# Patient Record
Sex: Female | Born: 1950 | Race: White | Hispanic: No | Marital: Married | Smoking: Former smoker
Health system: Southern US, Community
[De-identification: ages and names within clinical notes are randomized; demographics above are authoritative.]

## PROBLEM LIST (undated history)

## (undated) DIAGNOSIS — K635 Polyp of colon: Secondary | ICD-10-CM

## (undated) DIAGNOSIS — E041 Nontoxic single thyroid nodule: Secondary | ICD-10-CM

## (undated) DIAGNOSIS — R519 Headache, unspecified: Secondary | ICD-10-CM

## (undated) DIAGNOSIS — R32 Unspecified urinary incontinence: Secondary | ICD-10-CM

## (undated) HISTORY — PX: COLONOSCOPY: SHX174

## (undated) HISTORY — DX: Polyp of colon: K63.5

## (undated) HISTORY — DX: Nontoxic single thyroid nodule: E04.1

## (undated) HISTORY — DX: Unspecified urinary incontinence: R32

## (undated) HISTORY — PX: TUBAL LIGATION: SHX77

## (undated) HISTORY — PX: TONSILLECTOMY: SUR1361

---

## 2007-11-16 DIAGNOSIS — E042 Nontoxic multinodular goiter: Secondary | ICD-10-CM | POA: Insufficient documentation

## 2010-02-18 ENCOUNTER — Emergency Department: Payer: Self-pay | Admitting: Emergency Medicine

## 2010-03-03 ENCOUNTER — Ambulatory Visit: Payer: Self-pay | Admitting: Gastroenterology

## 2010-03-10 ENCOUNTER — Ambulatory Visit: Payer: Self-pay | Admitting: Gastroenterology

## 2010-03-23 ENCOUNTER — Ambulatory Visit: Payer: Self-pay | Admitting: Gastroenterology

## 2012-12-20 LAB — HM PAP SMEAR: HM PAP: NORMAL

## 2016-08-26 ENCOUNTER — Emergency Department
Admission: EM | Admit: 2016-08-26 | Discharge: 2016-08-26 | Disposition: A | Discharge status: Home / Self Care | Payer: Medicare Other | Attending: Emergency Medicine | Admitting: Emergency Medicine

## 2016-08-26 ENCOUNTER — Encounter: Payer: Self-pay | Admitting: Emergency Medicine

## 2016-08-26 DIAGNOSIS — Y939 Activity, unspecified: Secondary | ICD-10-CM | POA: Insufficient documentation

## 2016-08-26 DIAGNOSIS — S0990XA Unspecified injury of head, initial encounter: Secondary | ICD-10-CM | POA: Diagnosis not present

## 2016-08-26 DIAGNOSIS — W01198A Fall on same level from slipping, tripping and stumbling with subsequent striking against other object, initial encounter: Secondary | ICD-10-CM | POA: Insufficient documentation

## 2016-08-26 DIAGNOSIS — Y929 Unspecified place or not applicable: Secondary | ICD-10-CM | POA: Insufficient documentation

## 2016-08-26 DIAGNOSIS — S0101XA Laceration without foreign body of scalp, initial encounter: Secondary | ICD-10-CM | POA: Insufficient documentation

## 2016-08-26 DIAGNOSIS — Z87891 Personal history of nicotine dependence: Secondary | ICD-10-CM | POA: Diagnosis not present

## 2016-08-26 DIAGNOSIS — Y999 Unspecified external cause status: Secondary | ICD-10-CM | POA: Insufficient documentation

## 2016-08-26 DIAGNOSIS — W19XXXA Unspecified fall, initial encounter: Secondary | ICD-10-CM

## 2016-08-26 NOTE — ED Triage Notes (Signed)
Fell and hit head on stove. No LOC.  Abrasion to left forehead.  AAOx3.  Skin warm and dry.  MAE equally and strong.  Gait steady.

## 2016-08-26 NOTE — ED Provider Notes (Signed)
[facility] Emergency Department Provider Note  ____________________________________________  Time seen: Approximately 10:27 PM  I have reviewed the triage vital signs and the nursing notes.   HISTORY  Chief Complaint Head Injury    HPI Earlyn Zbikowski is a 66 y.o. female who presents emergency department complaining of fall, striking her head, scalp laceration. Patient reports that she suffered a mechanical fall after her cat tripped her. She fell striking her head against the stove. Patient denies any loss of consciousness. She denies any headache, visual changes, neck pain, chest pain, shortness of breath, abdominal pain, nausea or vomiting. Patient reports that she did suffer a minor laceration to the left side of the scalp but is unable to visualize on her own. Patient is up-to-date on all immunizations.Patient is not taking any blood thinners. She has not tried any medications prior to arrival. No other complaints at this time.   History reviewed. No pertinent past medical history.  There are no active problems to display for this patient.   History reviewed. No pertinent surgical history.  Prior to Admission medications   Not on File    Allergies Patient has no known allergies.  No family history on file.  Social History Social History  Substance Use Topics  . Smoking status: Former Games developer  . Smokeless tobacco: Never Used  . Alcohol use Not on file     Review of Systems  Constitutional: No fever/chills Eyes: No visual changes. No discharge ENT: No upper respiratory complaints. Cardiovascular: no chest pain. Respiratory: no cough. No SOB. Gastrointestinal: No abdominal pain.  No nausea, no vomiting.  Musculoskeletal: Negative for musculoskeletal pain. Skin: Positive for scalp laceration to the left side of the head. Neurological: Negative for headaches, focal weakness or numbness. 10-point ROS otherwise  negative.  ____________________________________________   PHYSICAL EXAM:  VITAL SIGNS: ED Triage Vitals  Enc Vitals Group     BP 08/26/16 2106 134/90     Pulse Rate 08/26/16 2106 85     Resp 08/26/16 2106 16     Temp 08/26/16 2106 98 F (36.7 C)     Temp Source 08/26/16 2106 Oral     SpO2 08/26/16 2106 100 %     Weight 08/26/16 2106 150 lb (68 kg)     Height 08/26/16 2106 5\' 3"  (1.6 m)     Head Circumference --      Peak Flow --      Pain Score 08/26/16 2103 4     Pain Loc --      Pain Edu? --      Excl. in GC? --      Constitutional: Alert and oriented. Well appearing and in no acute distress. Eyes: Conjunctivae are normal. PERRL. EOMI. Head: There 0.5 cm laceration noted to the left side of the scalp. Superficial in nature. No bleeding. No foreign body. Edges are well approximated. No underlying osseous tenderness to palpation. No palpable abnormality. No battle signs, raccoon eyes, serosanguineous fluid drainage from the ears or nares. ENT:      Ears:       Nose: No congestion/rhinnorhea.      Mouth/Throat: Mucous membranes are moist.  Neck: No stridor.  No cervical spine tenderness to palpation.  Cardiovascular: Normal rate, regular rhythm. Normal S1 and S2.  Good peripheral circulation. Respiratory: Normal respiratory effort without tachypnea or retractions. Lungs CTAB. Good air entry to the bases with no decreased or absent breath sounds. Musculoskeletal: Full range of motion to all extremities. No  gross deformities appreciated. Neurologic:  Normal speech and language. No gross focal neurologic deficits are appreciated. Cranial nerves II through XII grossly intact. Negative pronator drift or Romberg's. Equal grip strength. Sensation intact in all extremity dermatomes. Skin:  Skin is warm, dry and intact. No rash noted. Psychiatric: Mood and affect are normal. Speech and behavior are normal. Patient exhibits appropriate insight and  judgement.   ____________________________________________   LABS (all labs ordered are listed, but only abnormal results are displayed)  Labs Reviewed - No data to display ____________________________________________  EKG   ____________________________________________  RADIOLOGY   No results found.  ____________________________________________    PROCEDURES  Procedure(s) performed:    Procedures    Canadian CT Head Rule   CT head is recommended if yes to ANY of the following:   Major Criteria ("high risk" for an injury requiring neurosurgical intervention, sensitivity 100%):   No.   GCS < 15 at 2 hours post-injury No.   Suspected open or depressed skull fracture No.   Any sign of basilar skull fracture? (Hemotympanum, racoon eyes, battle's sign, CSF oto/rhinorrhea) No.   ? 2 episodes of vomiting No.   Age ? 65   Minor Criteria ("medium" risk for an intracranial traumatic finding, sensitivity 83-100%):   No.   Retrograde Amnesia to the Event ? 30 minutes No.   "Dangerous" Mechanism? (Pedestrian struck by motor vehicle, occupant ejected from motor vehicle, fall from >3 ft or >5 stairs.)   Based on my evaluation of the patient, including application of this decision instrument, CT head to evaluate for traumatic intracranial injury is not indicated at this time. I have discussed this recommendation with the patient who states understanding and agreement with this plan.  NEXUS C-spine Criteria   C-spine imaging is recommended if yes to ANY of the following (Mneumonic is "NSAID"):   No.  N - neurologic (focal) deficit present No.   S - spinal midline tenderness present No.  A - altered level of consciousness present No.    I  - intoxication present No.   D - distracting injury present   Based on my evaluation of the patient, including application of this decision instrument, cervical spine imaging to evaluate for injury is not indicated at this time. I have  discussed this recommendation with the patient who states understanding and agreement with this plan.    Medications - No data to display   ____________________________________________   INITIAL IMPRESSION / ASSESSMENT AND PLAN / ED COURSE  Pertinent labs & imaging results that were available during my care of the patient were reviewed by me and considered in my medical decision making (see chart for details).  Review of the [place] CSRS was performed in accordance of the [facility] prior to dispensing any controlled drugs.     Patient's diagnosis is consistent with fall resulting in minor head injury and minor laceration of the scalp. This time, patient is neurologically intact. Discussed at length imaging versus no imaging. Patient does not have any concerning factors. Exam is reassuring. At this time, patient opts for no imaging. I agree with this decision as patient is negative Canadian head CT and Nexus criteria. Laceration is superficial in nature and does not require closure. Tylenol and Motrin at home as needed. She will follow primary care as needed.  Patient is given ED precautions to return to the ED for any worsening or new symptoms.     ____________________________________________  FINAL CLINICAL IMPRESSION(S) / ED DIAGNOSES  Final diagnoses:  Fall, initial encounter  Minor head injury, initial encounter  Laceration of scalp, initial encounter      NEW MEDICATIONS STARTED DURING THIS VISIT:  New Prescriptions   No medications on file        This chart was dictated using voice recognition software/Dragon. Despite best efforts to proofread, errors can occur which can change the meaning. Any change was purely unintentional.    Racheal PatchesCuthriell, Jonathan D, PA-C 08/26/16 2232    Myrna BlazerSchaevitz, David Matthew, MD 08/26/16 2350

## 2017-02-23 ENCOUNTER — Encounter: Payer: Self-pay | Admitting: Obstetrics and Gynecology

## 2017-02-23 ENCOUNTER — Ambulatory Visit (INDEPENDENT_AMBULATORY_CARE_PROVIDER_SITE_OTHER): Payer: Medicare Other | Admitting: Obstetrics and Gynecology

## 2017-02-23 VITALS — BP 124/78 | Ht 63.0 in | Wt 151.0 lb

## 2017-02-23 DIAGNOSIS — Z131 Encounter for screening for diabetes mellitus: Secondary | ICD-10-CM

## 2017-02-23 DIAGNOSIS — Z13220 Encounter for screening for lipoid disorders: Secondary | ICD-10-CM

## 2017-02-23 DIAGNOSIS — Z0000 Encounter for general adult medical examination without abnormal findings: Secondary | ICD-10-CM | POA: Diagnosis not present

## 2017-02-23 DIAGNOSIS — Z1231 Encounter for screening mammogram for malignant neoplasm of breast: Secondary | ICD-10-CM | POA: Diagnosis not present

## 2017-02-23 DIAGNOSIS — Z1211 Encounter for screening for malignant neoplasm of colon: Secondary | ICD-10-CM

## 2017-02-23 DIAGNOSIS — Z1331 Encounter for screening for depression: Secondary | ICD-10-CM

## 2017-02-23 DIAGNOSIS — E2839 Other primary ovarian failure: Secondary | ICD-10-CM

## 2017-02-23 DIAGNOSIS — R87612 Low grade squamous intraepithelial lesion on cytologic smear of cervix (LGSIL): Secondary | ICD-10-CM | POA: Diagnosis not present

## 2017-02-23 DIAGNOSIS — Z1321 Encounter for screening for nutritional disorder: Secondary | ICD-10-CM | POA: Diagnosis not present

## 2017-02-23 DIAGNOSIS — Z1239 Encounter for other screening for malignant neoplasm of breast: Secondary | ICD-10-CM

## 2017-02-23 DIAGNOSIS — Z1329 Encounter for screening for other suspected endocrine disorder: Secondary | ICD-10-CM

## 2017-02-23 DIAGNOSIS — Z124 Encounter for screening for malignant neoplasm of cervix: Secondary | ICD-10-CM | POA: Diagnosis not present

## 2017-02-23 DIAGNOSIS — Z1339 Encounter for screening examination for other mental health and behavioral disorders: Secondary | ICD-10-CM | POA: Diagnosis not present

## 2017-02-23 DIAGNOSIS — Z01419 Encounter for gynecological examination (general) (routine) without abnormal findings: Secondary | ICD-10-CM

## 2017-02-23 NOTE — Progress Notes (Addendum)
Routine Annual Gynecology Examination   PCP: Conard Novak, MD  Chief Complaint  Patient presents with  . Annual Exam    History of Present Illness: Patient is a 67 y.o. [id number] presents for annual exam. The patient has no complaints today.   Menopausal bleeding: denies  Menopausal symptoms: denies  Breast symptoms: denies  Last pap smear: 4 years ago.  Result Normal  Last mammogram: multiplle years ago.  Result Normal  Past Medical History:  Diagnosis Date  . Colon polyp   . Thyroid nodule   . Urinary incontinence     Past Surgical History:  Procedure Laterality Date  . COLONOSCOPY    . TONSILLECTOMY    . TUBAL LIGATION      Prior to Admission medications   Medication Sig Start Date End Date Taking? Authorizing Provider  cholecalciferol (VITAMIN D) 400 units TABS tablet Take 400 Units by mouth.   Yes [provider]  Melatonin 1 MG TABS Take by mouth.   Yes [provider]  Pyridoxine HCl (VITAMIN B-6) 500 MG tablet Take 500 mg by mouth daily.   Yes [provider]   Allergies: No Known Allergies  Obstetric History: [id number]  Social History   Socioeconomic History  . Marital status: Married    Spouse name: Not on file  . Number of children: Not on file  . Years of education: Not on file  . Highest education level: Not on file  Social Needs  . Financial resource strain: Not on file  . Food insecurity - worry: Not on file  . Food insecurity - inability: Not on file  . Transportation needs - medical: Not on file  . Transportation needs - non-medical: Not on file  Occupational History  . Not on file  Tobacco Use  . Smoking status: Former Games developer  . Smokeless tobacco: Never Used  Substance and Sexual Activity  . Alcohol use: No    Frequency: Never  . Drug use: No  . Sexual activity: Not Currently    Birth control/protection: Post-menopausal  Other Topics Concern  . Not on file  Social History Narrative  . Not on  file    Family History  Problem Relation Age of Onset  . Diabetes Mellitus II Mother   . Heart attack Father   . Stroke Father   . Diabetes Mellitus II Sister     Review of Systems  Constitutional: Negative.   HENT: Negative.   Eyes: Negative.   Respiratory: Negative.   Cardiovascular: Negative.   Gastrointestinal: Negative.   Genitourinary: Negative.   Musculoskeletal: Negative.   Skin: Negative.   Neurological: Negative.   Psychiatric/Behavioral: Negative.      Physical Exam Vitals: BP 124/78   Ht 5\' 3"  (1.6 m)   Wt 151 lb (68.5 kg)   BMI 26.75 kg/m   Physical Exam  Constitutional: She is oriented to person, place, and time. She appears well-developed and well-nourished. No distress.  Genitourinary: Uterus normal. Pelvic exam was performed with patient supine. There is no rash, tenderness, lesion or injury on the right labia. There is no rash, tenderness, lesion or injury on the left labia. No erythema, tenderness or bleeding in the vagina. No signs of injury around the vagina. No vaginal discharge found. Right adnexum does not display mass, does not display tenderness and does not display fullness. Left adnexum does not display mass, does not display tenderness and does not display fullness. Cervix does not exhibit motion tenderness, lesion, discharge or  polyp.   Uterus is mobile and anteverted. Uterus is not enlarged, tender or exhibiting a mass.  HENT:  Head: Normocephalic and atraumatic.  Eyes: EOM are normal. No scleral icterus.  Neck: Normal range of motion. Neck supple. No thyromegaly present.  Cardiovascular: Normal rate and regular rhythm. Exam reveals no gallop and no friction rub.  No murmur heard. Pulmonary/Chest: Effort normal and breath sounds normal. No respiratory distress. She has no wheezes. She has no rales. Right breast exhibits no inverted nipple, no mass, no nipple discharge, no skin change and no tenderness. Left breast exhibits no inverted nipple,  no mass, no nipple discharge, no skin change and no tenderness.  Abdominal: Soft. Bowel sounds are normal. She exhibits no distension and no mass. There is no tenderness. There is no rebound and no guarding.  Musculoskeletal: Normal range of motion. She exhibits no edema or tenderness.  Lymphadenopathy:    She has no cervical adenopathy.       Right: No inguinal adenopathy present.       Left: No inguinal adenopathy present.  Neurological: She is alert and oriented to person, place, and time. No cranial nerve deficit.  Skin: Skin is warm and dry. No rash noted. No erythema.  Psychiatric: She has a normal mood and affect. Her behavior is normal. Judgment normal.     Female chaperone present for pelvic and breast  portions of the physical exam  Results: AUDIT Questionnaire (screen for alcoholism): 1 PHQ-9: 2   Assessment and Plan:  67 y.o. [id number] female here for routine annual gynecologic examination  Plan: Problem List Items Addressed This Visit    None    Visit Diagnoses    Women's annual routine gynecological examination    -  Primary   Relevant Orders   Hgb A1c w/o eAG   CBC   Comprehensive metabolic panel   Lipid Panel With LDL/HDL Ratio   Pap IG (Image Guided) (Completed)   DG Bone Density   Screening for depression       Screening for alcoholism       Pap smear for cervical cancer screening       Relevant Orders   Pap IG (Image Guided) (Completed)   Screening for diabetes mellitus       Relevant Orders   Hgb A1c w/o eAG   Screening cholesterol level       Relevant Orders   Lipid Panel With LDL/HDL Ratio   Screening for thyroid disorder       Screening for breast cancer       Laboratory exam ordered as part of routine general medical examination       Relevant Orders   CBC   Comprehensive metabolic panel   Encounter for vitamin deficiency screening       Screen for colon cancer       Relevant Orders   Ambulatory referral to Gastroenterology   Estrogen  deficiency       Relevant Orders   DG Bone Density      Screening: -- Blood pressure screen normal -- Colonoscopy - due - will schedule -- Mammogram - due. Patient to call [facility] to arrange. She understands that it is her responsibility to arrange this. -- Weight screening: normal -- Depression screening negative (PHQ-9) -- Nutrition: normal -- cholesterol screening: will obtain -- osteoporosis screening: not due -- tobacco screening: not using -- alcohol screening: AUDIT questionnaire indicates low-risk usage. -- family history of breast cancer screening: done. not at high  risk. -- no evidence of domestic violence or intimate partner violence. -- STD screening: gonorrhea/chlamydia NAAT not collected per patient request. -- pap smear collected per ASCCP guidelines -- flu vaccine declined -- HPV vaccination series: not eligilbe    Thomasene MohairStephen Garmon Dehn, MD 03/06/2017 8:27 AM

## 2017-02-24 ENCOUNTER — Encounter: Payer: Self-pay | Admitting: Obstetrics and Gynecology

## 2017-02-28 ENCOUNTER — Other Ambulatory Visit: Payer: Self-pay

## 2017-02-28 ENCOUNTER — Telehealth: Payer: Self-pay

## 2017-02-28 DIAGNOSIS — Z1211 Encounter for screening for malignant neoplasm of colon: Secondary | ICD-10-CM

## 2017-02-28 NOTE — Telephone Encounter (Signed)
Gastroenterology Pre-Procedure Review  Request Date: 03/16/17 Requesting Physician: Dr. Servando SnareWohl  PATIENT REVIEW QUESTIONS: The patient responded to the following health history questions as indicated:    1. Are you having any GI issues? no 2. Do you have a personal history of Polyps? yes (4 years ago) 3. Do you have a family history of Colon Cancer or Polyps? yes (mom polyps) 4. Diabetes Mellitus? no 5. Joint replacements in the past 12 months?no 6. Major health problems in the past 3 months?no 7. Any artificial heart valves, MVP, or defibrillator?no    MEDICATIONS & ALLERGIES:    Patient reports the following regarding taking any anticoagulation/antiplatelet therapy:   Plavix, Coumadin, Eliquis, Xarelto, Lovenox, Pradaxa, Brilinta, or Effient? no Aspirin? no  Patient confirms/reports the following medications:  Current Outpatient Medications  Medication Sig Dispense Refill  . cholecalciferol (VITAMIN D) 400 units TABS tablet Take 400 Units by mouth.    . Melatonin 1 MG TABS Take by mouth.    . Pyridoxine HCl (VITAMIN B-6) 500 MG tablet Take 500 mg by mouth daily.     No current facility-administered medications for this visit.     Patient confirms/reports the following allergies:  No Known Allergies  No orders of the defined types were placed in this encounter.   AUTHORIZATION INFORMATION Primary Insurance: 1D#: Group #:  Secondary Insurance: 1D#: Group #:  SCHEDULE INFORMATION: Date: 03/16/17 Time: Location:[facility]

## 2017-03-01 LAB — PAP IG (IMAGE GUIDED): PAP Smear Comment: 0

## 2017-03-06 ENCOUNTER — Telehealth: Payer: Self-pay | Admitting: Obstetrics and Gynecology

## 2017-03-06 NOTE — Addendum Note (Signed)
Addended by: Thomasene MohairJACKSON, Luther Newhouse D on: 03/06/2017 08:27 AM   Modules accepted: Orders

## 2017-03-06 NOTE — Telephone Encounter (Signed)
Discussed results and need for colposcopy. Patient to call to schedule. All questions answered. Result released through MyChart.

## 2017-03-06 NOTE — Telephone Encounter (Signed)
Patient is aware of DEXA scan on Wed, 03/29/17 @ 1:40pm [facility]. Patient was given directions.

## 2017-03-14 NOTE — Discharge Instructions (Signed)
General Anesthesia, Adult, Care After °These instructions provide you with information about caring for yourself after your procedure. Your health care provider may also give you more specific instructions. Your treatment has been planned according to current medical practices, but problems sometimes occur. Call your health care provider if you have any problems or questions after your procedure. °What can I expect after the procedure? °After the procedure, it is common to have: °· Vomiting. °· A sore throat. °· Mental slowness. ° °It is common to feel: °· Nauseous. °· Cold or shivery. °· Sleepy. °· Tired. °· Sore or achy, even in parts of your body where you did not have surgery. ° °Follow these instructions at home: °For at least 24 hours after the procedure: °· Do not: °? Participate in activities where you could fall or become injured. °? Drive. °? Use heavy machinery. °? Drink alcohol. °? Take sleeping pills or medicines that cause drowsiness. °? Make important decisions or sign legal documents. °? Take care of children on your own. °· Rest. °Eating and drinking °· If you vomit, drink water, juice, or soup when you can drink without vomiting. °· Drink enough fluid to keep your urine clear or pale yellow. °· Make sure you have little or no nausea before eating solid foods. °· Follow the diet recommended by your health care provider. °General instructions °· Have a responsible adult stay with you until you are awake and alert. °· Return to your normal activities as told by your health care provider. Ask your health care provider what activities are safe for you. °· Take over-the-counter and prescription medicines only as told by your health care provider. °· If you smoke, do not smoke without supervision. °· Keep all follow-up visits as told by your health care provider. This is important. °Contact a health care provider if: °· You continue to have nausea or vomiting at home, and medicines are not helpful. °· You  cannot drink fluids or start eating again. °· You cannot urinate after 8-12 hours. °· You develop a skin rash. °· You have fever. °· You have increasing redness at the site of your procedure. °Get help right away if: °· You have difficulty breathing. °· You have chest pain. °· You have unexpected bleeding. °· You feel that you are having a life-threatening or urgent problem. °This information is not intended to replace advice given to you by your health care provider. Make sure you discuss any questions you have with your health care provider. °Document Released: 04/11/2000 Document Revised: 06/08/2015 Document Reviewed: 12/18/2014 °Elsevier Interactive Patient Education © 2018 [employer] Inc[employer] ° °

## 2017-03-15 ENCOUNTER — Ambulatory Visit (INDEPENDENT_AMBULATORY_CARE_PROVIDER_SITE_OTHER): Payer: Medicare Other | Admitting: Obstetrics and Gynecology

## 2017-03-15 ENCOUNTER — Encounter: Payer: Self-pay | Admitting: Obstetrics and Gynecology

## 2017-03-15 VITALS — BP 138/84 | Ht 63.0 in

## 2017-03-15 DIAGNOSIS — N72 Inflammatory disease of cervix uteri: Secondary | ICD-10-CM | POA: Diagnosis not present

## 2017-03-15 DIAGNOSIS — R87612 Low grade squamous intraepithelial lesion on cytologic smear of cervix (LGSIL): Secondary | ICD-10-CM

## 2017-03-15 DIAGNOSIS — B977 Papillomavirus as the cause of diseases classified elsewhere: Secondary | ICD-10-CM | POA: Diagnosis not present

## 2017-03-15 DIAGNOSIS — N870 Mild cervical dysplasia: Secondary | ICD-10-CM | POA: Diagnosis not present

## 2017-03-15 NOTE — Progress Notes (Signed)
HPI:  Nancy Russo is a 67 y.o.  [id number]  who presents today for evaluation and management of abnormal cervical cytology.    Dysplasia History:  LGSIL   OB History  Gravida Para Term Preterm AB Living  5 4 4   1 4   SAB TAB Ectopic Multiple Live Births          4    # Outcome Date GA Lbr Len/2nd Weight Sex Delivery Anes PTL Lv  5 AB           4 Term           3 Term           2 Term           1 Term               Past Medical History:  Diagnosis Date  . Colon polyp   . Thyroid nodule   . Urinary incontinence     Past Surgical History:  Procedure Laterality Date  . COLONOSCOPY    . TONSILLECTOMY    . TUBAL LIGATION      SOCIAL HISTORY: Social History   Substance and Sexual Activity  Alcohol Use No  . Frequency: Never   Social History   Substance and Sexual Activity  Drug Use No     Family History  Problem Relation Age of Onset  . Diabetes Mellitus II Mother   . Heart attack Father   . Stroke Father   . Diabetes Mellitus II Sister     ALLERGIES:  Patient has no known allergies.  Current Outpatient Medications on File Prior to Visit  Medication Sig Dispense Refill  . cholecalciferol (VITAMIN D) 400 units TABS tablet Take 400 Units by mouth.    . Chromium 1 MG CAPS Take 1 mg by mouth daily.    . Melatonin 1 MG TABS Take by mouth.    . Pyridoxine HCl (VITAMIN B-6) 500 MG tablet Take 500 mg by mouth daily.     No current facility-administered medications on file prior to visit.     Physical Exam: -Vitals:  BP 138/84   Ht 5\' 3"  (1.6 m)   BMI 26.39 kg/m  GEN: WD, WN, NAD.  A+ O x 3, good mood and affect. ABD:  NT, ND.  Soft, no masses.  No hernias noted.   Pelvic:   Vulva: Normal appearance.  No lesions.  Vagina: No lesions or abnormalities noted.  Support: Normal pelvic support.  Urethra No masses tenderness or scarring.  Meatus Normal size without lesions or prolapse.  Cervix: See below.  Anus: Normal exam.  No lesions.  Perineum:  Normal exam.  No lesions.        Bimanual   Uterus: Normal size.  Non-tender.  Mobile.  AV.  Adnexae: No masses.  Non-tender to palpation.  Cul-de-sac: Negative for abnormality.   PROCEDURE: 1.  Urine Pregnancy Test:  not done 2.  Colposcopy performed with 4% acetic acid after verbal consent obtained                                         -Aceto-white Lesions Location(s): diffusely o'clock.              -Biopsy performed at 5, 7, and 10 o'clock               -ECC indicated  and performed: Yes.       -Biopsy sites made hemostatic with pressure and Monsel's solution   -Satisfactory colposcopy: No.    -Evidence of Invasive cervical CA :  NO  ASSESSMENT:  Nancy Russo is a 67 y.o. [id number] here for No diagnosis found.Marland Kitchen.  PLAN: I discussed the grading system of pap smears and HPV high risk viral types.  We will discuss and base management after colpo results return.    Thomasene MohairStephen Chivonne Rascon, MD, Merlinda FrederickFACOG [facility], [facility]   03/15/2017  11:44 AM

## 2017-03-16 ENCOUNTER — Ambulatory Visit
Admission: RE | Admit: 2017-03-16 | Discharge: 2017-03-16 | Disposition: A | Discharge status: Home / Self Care | Payer: Medicare Other | Source: Ambulatory Visit | Attending: Gastroenterology | Admitting: Gastroenterology

## 2017-03-16 ENCOUNTER — Encounter
Admission: RE | Disposition: A | Discharge status: Home / Self Care | Payer: Self-pay | Source: Ambulatory Visit | Attending: Gastroenterology

## 2017-03-16 ENCOUNTER — Ambulatory Visit: Payer: Medicare Other | Admitting: Anesthesiology

## 2017-03-16 ENCOUNTER — Encounter: Payer: Self-pay | Admitting: Obstetrics and Gynecology

## 2017-03-16 DIAGNOSIS — Z87891 Personal history of nicotine dependence: Secondary | ICD-10-CM | POA: Insufficient documentation

## 2017-03-16 DIAGNOSIS — Z1211 Encounter for screening for malignant neoplasm of colon: Secondary | ICD-10-CM | POA: Insufficient documentation

## 2017-03-16 DIAGNOSIS — Z86010 Personal history of colonic polyps: Secondary | ICD-10-CM | POA: Insufficient documentation

## 2017-03-16 DIAGNOSIS — K640 First degree hemorrhoids: Secondary | ICD-10-CM | POA: Insufficient documentation

## 2017-03-16 HISTORY — PX: COLONOSCOPY WITH PROPOFOL: SHX5780

## 2017-03-16 SURGERY — COLONOSCOPY WITH PROPOFOL
Anesthesia: General | Site: Rectum | Wound class: Contaminated

## 2017-03-16 MED ORDER — ONDANSETRON HCL 4 MG/2ML IJ SOLN: 4.0000 mg | Freq: Once | INTRAMUSCULAR | Status: DC | PRN

## 2017-03-16 MED ORDER — LACTATED RINGERS IV SOLN
INTRAVENOUS | Status: DC
  Administered 2017-03-16: 09:00:00 via INTRAVENOUS

## 2017-03-16 MED ORDER — ACETAMINOPHEN 160 MG/5ML PO SOLN: 325.0000 mg | ORAL | Status: DC | PRN

## 2017-03-16 MED ORDER — PROPOFOL 10 MG/ML IV BOLUS
INTRAVENOUS | Status: DC | PRN
  Administered 2017-03-16: 50 mg via INTRAVENOUS
  Administered 2017-03-16 (×4): 40 mg via INTRAVENOUS
  Administered 2017-03-16: 150 mg via INTRAVENOUS

## 2017-03-16 MED ORDER — STERILE WATER FOR IRRIGATION IR SOLN
Status: DC | PRN
  Administered 2017-03-16: .5 mL

## 2017-03-16 MED ORDER — LIDOCAINE HCL (CARDIAC) 20 MG/ML IV SOLN
INTRAVENOUS | Status: DC | PRN
  Administered 2017-03-16: 50 mg via INTRAVENOUS

## 2017-03-16 MED ORDER — SODIUM CHLORIDE 0.9 % IV SOLN: INTRAVENOUS | Status: DC

## 2017-03-16 MED ORDER — ACETAMINOPHEN 325 MG PO TABS: 650.0000 mg | ORAL_TABLET | Freq: Once | ORAL | Status: DC | PRN

## 2017-03-16 SURGICAL SUPPLY — 5 items
CANISTER SUCT 1200ML W/VALVE (MISCELLANEOUS) ×3 IMPLANT
GOWN CVR UNV OPN BCK APRN NK (MISCELLANEOUS) ×2 IMPLANT
GOWN ISOL THUMB LOOP REG UNIV (MISCELLANEOUS) ×4
KIT ENDO PROCEDURE OLY (KITS) ×3 IMPLANT
WATER STERILE IRR 250ML POUR (IV SOLUTION) ×3 IMPLANT

## 2017-03-16 NOTE — Op Note (Signed)
[facility] Gastroenterology Patient Name: Nancy Russo Procedure Date: 03/16/2017 9:58 AM MRN: [id number] Account #: [id number] Date of Birth: 10/24/1950 Admit Type: Outpatient Age: 67 Room: [facility] OR ROOM 01 Gender: Female Note Status: Finalized Procedure:            Colonoscopy Indications:          High risk colon cancer surveillance: Personal history                        of colonic polyps Providers:            Midge Minium MD, MD Referring MD:         Conard Novak (Referring MD) Medicines:            Propofol per Anesthesia Complications:        No immediate complications. Procedure:            Pre-Anesthesia Assessment:                       - Prior to the procedure, a History and Physical was                        performed, and patient medications and allergies were                        reviewed. The patient's tolerance of previous                        anesthesia was also reviewed. The risks and benefits of                        the procedure and the sedation options and risks were                        discussed with the patient. All questions were                        answered, and informed consent was obtained. Prior                        Anticoagulants: The patient has taken no previous                        anticoagulant or antiplatelet agents. ASA Grade                        Assessment: II - A patient with mild systemic disease.                        After reviewing the risks and benefits, the patient was                        deemed in satisfactory condition to undergo the                        procedure.                       After obtaining informed consent, the colonoscope was  passed under direct vision. Throughout the procedure,                        the patient's blood pressure, pulse, and oxygen                        saturations were monitored continuously. The Olympus   Colonoscope [id number]) was introduced through the                        anus and advanced to the the cecum, identified by                        appendiceal orifice and ileocecal valve. The                        colonoscopy was performed without difficulty. The                        patient tolerated the procedure well. The quality of                        the bowel preparation was excellent. Findings:      The perianal and digital rectal examinations were normal.      Non-bleeding internal hemorrhoids were found during retroflexion. The       hemorrhoids were Grade I (internal hemorrhoids that do not prolapse). Impression:           - Non-bleeding internal hemorrhoids.                       - No specimens collected. Recommendation:       - Discharge patient to home.                       - Resume previous diet.                       - Continue present medications.                       - Repeat colonoscopy in 5 years for surveillance. Procedure Code(s):    --- Professional ---                       [id number], Colonoscopy, flexible; diagnostic, including                        collection of specimen(s) by brushing or washing, when                        performed (separate procedure) Diagnosis Code(s):    --- Professional ---                       Z86.010, Personal history of colonic polyps CPT copyright 2016 American Medical Association. All rights reserved. The codes documented in this report are preliminary and upon coder review may  be revised to meet current compliance requirements. Midge Miniumarren Mineola Duan MD, MD 03/16/2017 10:29:56 AM This report has been signed electronically. Number of Addenda: 0 Note Initiated On: 03/16/2017 9:58 AM Scope Withdrawal Time: 0 hours 6 minutes 28 seconds  Total Procedure Duration: 0 hours 11 minutes  52 seconds       [facility]

## 2017-03-16 NOTE — Anesthesia Procedure Notes (Signed)
Date/Time: 03/16/2017 10:11 AM Performed by: Maree KrabbeWarren, Ahtziry Saathoff, CRNA Pre-anesthesia Checklist: Patient identified, Emergency Drugs available, Suction available, Timeout performed and Patient being monitored Patient Re-evaluated:Patient Re-evaluated prior to induction Oxygen Delivery Method: Nasal cannula Placement Confirmation: positive ETCO2

## 2017-03-16 NOTE — H&P (Signed)
   Nancy Miniumarren Zabrina Brotherton, MD [facility] [address] [place], [place] [address] Phone:[phone] Fax : [phone]  Primary Care Physician:  Conard NovakJackson, Stephen D, MD Primary Gastroenterologist:  Dr. Servando SnareWohl  Pre-Procedure History & Physical: HPI:  Nancy Russo is a 67 y.o. female is here for an colonoscopy.   Past Medical History:  Diagnosis Date  . Colon polyp   . Thyroid nodule   . Urinary incontinence     Past Surgical History:  Procedure Laterality Date  . COLONOSCOPY    . TONSILLECTOMY    . TUBAL LIGATION      Prior to Admission medications   Medication Sig Start Date End Date Taking? Authorizing Provider  cholecalciferol (VITAMIN D) 400 units TABS tablet Take 400 Units by mouth.   Yes [provider]  Chromium 1 MG CAPS Take 1 mg by mouth daily.   Yes [provider]  Melatonin 1 MG TABS Take by mouth.   Yes [provider]  Pyridoxine HCl (VITAMIN B-6) 500 MG tablet Take 500 mg by mouth daily.   Yes [provider]    Allergies as of 02/28/2017  . (No Known Allergies)    Family History  Problem Relation Age of Onset  . Diabetes Mellitus II Mother   . Heart attack Father   . Stroke Father   . Diabetes Mellitus II Sister     Social History   Socioeconomic History  . Marital status: Married    Spouse name: Not on file  . Number of children: Not on file  . Years of education: Not on file  . Highest education level: Not on file  Social Needs  . Financial resource strain: Not on file  . Food insecurity - worry: Not on file  . Food insecurity - inability: Not on file  . Transportation needs - medical: Not on file  . Transportation needs - non-medical: Not on file  Occupational History  . Not on file  Tobacco Use  . Smoking status: Former Games developermoker  . Smokeless tobacco: Never Used  Substance and Sexual Activity  . Alcohol use: No    Frequency: Never  . Drug use: No  . Sexual activity: Not Currently    Birth  control/protection: Post-menopausal  Other Topics Concern  . Not on file  Social History Narrative  . Not on file    Review of Systems: See HPI, otherwise negative ROS  Physical Exam: BP (!) 155/76   Pulse 80   Temp 98.1 F (36.7 C) (Temporal)   Resp 16   Ht 5\' 3"  (1.6 m)   Wt 149 lb 8 oz (67.8 kg)   SpO2 100%   BMI 26.48 kg/m  General:   Alert,  pleasant and cooperative in NAD Head:  Normocephalic and atraumatic. Neck:  Supple; no masses or thyromegaly. Lungs:  Clear throughout to auscultation.    Heart:  Regular rate and rhythm. Abdomen:  Soft, nontender and nondistended. Normal bowel sounds, without guarding, and without rebound.   Neurologic:  Alert and  oriented x4;  grossly normal neurologically.  Impression/Plan: Nancy Russo is here for an colonoscopy to be performed for history of colon polyps  Risks, benefits, limitations, and alternatives regarding  colonoscopy have been reviewed with the patient.  Questions have been answered.  All parties agreeable.   Nancy Miniumarren Mikaia Janvier, MD  03/16/2017, 9:58 AM

## 2017-03-16 NOTE — Anesthesia Postprocedure Evaluation (Signed)
Anesthesia Post Note  Patient: Birgit Imm  Procedure(s) Performed: COLONOSCOPY WITH PROPOFOL (N/A Rectum)  Patient location during evaluation: PACU Anesthesia Type: General Level of consciousness: awake and alert, oriented and patient cooperative Pain management: pain level controlled Vital Signs Assessment: post-procedure vital signs reviewed and stable Respiratory status: spontaneous breathing, nonlabored ventilation and respiratory function stable Cardiovascular status: blood pressure returned to baseline and stable Postop Assessment: adequate PO intake Anesthetic complications: no    Reed BreechAndrea Roemello Speyer

## 2017-03-16 NOTE — Anesthesia Preprocedure Evaluation (Signed)
Anesthesia Evaluation  Patient identified by MRN, date of birth, ID band Patient awake    Reviewed: Allergy & Precautions, NPO status , Patient's Chart, lab work & pertinent test results  History of Anesthesia Complications Negative for: history of anesthetic complications  Airway Mallampati: I  TM Distance: >3 FB Neck ROM: Full    Dental  (+) Partial Upper   Pulmonary  Snoring    Pulmonary exam normal breath sounds clear to auscultation       Cardiovascular Exercise Tolerance: Good negative cardio ROS Normal cardiovascular exam Rhythm:Regular Rate:Normal     Neuro/Psych negative neurological ROS     GI/Hepatic negative GI ROS,   Endo/Other  negative endocrine ROS  Renal/GU negative Renal ROS     Musculoskeletal   Abdominal   Peds  Hematology negative hematology ROS (+)   Anesthesia Other Findings   Reproductive/Obstetrics                             Anesthesia Physical Anesthesia Plan  ASA: I  Anesthesia Plan: General   Post-op Pain Management:    Induction: Intravenous  PONV Risk Score and Plan: 2 and Propofol infusion and TIVA  Airway Management Planned: Natural Airway  Additional Equipment:   Intra-op Plan:   Post-operative Plan:   Informed Consent: I have reviewed the patients History and Physical, chart, labs and discussed the procedure including the risks, benefits and alternatives for the proposed anesthesia with the patient or authorized representative who has indicated his/her understanding and acceptance.     Plan Discussed with: CRNA  Anesthesia Plan Comments:         Anesthesia Quick Evaluation

## 2017-03-16 NOTE — Transfer of Care (Signed)
Immediate Anesthesia Transfer of Care Note  Patient: Nancy Russo  Procedure(s) Performed: COLONOSCOPY WITH PROPOFOL (N/A Rectum)  Patient Location: PACU  Anesthesia Type: General  Level of Consciousness: awake, alert  and patient cooperative  Airway and Oxygen Therapy: Patient Spontanous Breathing and Patient connected to supplemental oxygen  Post-op Assessment: Post-op Vital signs reviewed, Patient's Cardiovascular Status Stable, Respiratory Function Stable, Patent Airway and No signs of Nausea or vomiting  Post-op Vital Signs: Reviewed and stable  Complications: No apparent anesthesia complications

## 2017-03-20 LAB — PATHOLOGY

## 2017-03-22 ENCOUNTER — Telehealth: Payer: Self-pay | Admitting: Obstetrics and Gynecology

## 2017-03-22 NOTE — Telephone Encounter (Signed)
Discussed CIN 1 finding.  Recommend repeat pap smear in one year per ASCCP. All questions answered.

## 2017-03-29 ENCOUNTER — Ambulatory Visit
Admission: RE | Admit: 2017-03-29 | Discharge: 2017-03-29 | Disposition: A | Discharge status: Home / Self Care | Payer: Medicare Other | Source: Ambulatory Visit | Attending: Obstetrics and Gynecology | Admitting: Obstetrics and Gynecology

## 2017-03-29 DIAGNOSIS — M8589 Other specified disorders of bone density and structure, multiple sites: Secondary | ICD-10-CM | POA: Diagnosis not present

## 2017-03-29 DIAGNOSIS — Z01419 Encounter for gynecological examination (general) (routine) without abnormal findings: Secondary | ICD-10-CM

## 2017-03-29 DIAGNOSIS — M8588 Other specified disorders of bone density and structure, other site: Secondary | ICD-10-CM | POA: Insufficient documentation

## 2017-03-29 DIAGNOSIS — Z780 Asymptomatic menopausal state: Secondary | ICD-10-CM | POA: Diagnosis not present

## 2017-03-29 DIAGNOSIS — E2839 Other primary ovarian failure: Secondary | ICD-10-CM | POA: Insufficient documentation

## 2018-09-03 ENCOUNTER — Encounter: Payer: Self-pay | Admitting: Obstetrics and Gynecology

## 2018-09-03 ENCOUNTER — Telehealth: Payer: Self-pay | Admitting: Obstetrics and Gynecology

## 2018-09-03 ENCOUNTER — Other Ambulatory Visit: Payer: Self-pay

## 2018-09-03 ENCOUNTER — Other Ambulatory Visit (HOSPITAL_COMMUNITY)
Admission: RE | Admit: 2018-09-03 | Discharge: 2018-09-03 | Disposition: A | Discharge status: Home / Self Care | Payer: Medicare Other | Source: Ambulatory Visit | Attending: Obstetrics and Gynecology | Admitting: Obstetrics and Gynecology

## 2018-09-03 ENCOUNTER — Ambulatory Visit (INDEPENDENT_AMBULATORY_CARE_PROVIDER_SITE_OTHER): Payer: Medicare Other | Admitting: Obstetrics and Gynecology

## 2018-09-03 VITALS — BP 122/74 | Wt 150.0 lb

## 2018-09-03 DIAGNOSIS — Z1231 Encounter for screening mammogram for malignant neoplasm of breast: Secondary | ICD-10-CM

## 2018-09-03 DIAGNOSIS — R87612 Low grade squamous intraepithelial lesion on cytologic smear of cervix (LGSIL): Secondary | ICD-10-CM | POA: Diagnosis not present

## 2018-09-03 DIAGNOSIS — Z780 Asymptomatic menopausal state: Secondary | ICD-10-CM | POA: Insufficient documentation

## 2018-09-03 LAB — HM PAP SMEAR: HM Pap smear: NORMAL

## 2018-09-03 NOTE — Telephone Encounter (Signed)
Spoke with patient about her prior mammos, pt stated that she had them done here previously. Patient stated that she will call [facility] and get appt set up for her mammo.

## 2018-09-03 NOTE — Progress Notes (Signed)
Obstetrics & Gynecology Office Visit   Chief Complaint:  Chief Complaint  Patient presents with  . Follow-up    follow up pap smear    History of Present Illness: 68 y.o. [id number] female who presents for a follow up pap smear. Early last year she had an LGSIL pap smear.  Her colposcopy showed CIN 1.  She presents in follow up for this. She has no acute complaints today.   Past Medical History:  Diagnosis Date  . Colon polyp   . Thyroid nodule   . Urinary incontinence     Past Surgical History:  Procedure Laterality Date  . COLONOSCOPY    . COLONOSCOPY WITH PROPOFOL N/A 03/16/2017   Procedure: COLONOSCOPY WITH PROPOFOL;  Surgeon: Lucilla Lame, MD;  Location: [facility];  Service: Endoscopy;  Laterality: N/A;  . TONSILLECTOMY    . TUBAL LIGATION      Gynecologic History: No LMP recorded. Patient is postmenopausal.  Obstetric History: [id number]  Family History  Problem Relation Age of Onset  . Diabetes Mellitus II Mother   . Heart attack Father   . Stroke Father   . Diabetes Mellitus II Sister     Social History   Socioeconomic History  . Marital status: Married    Spouse name: Not on file  . Number of children: Not on file  . Years of education: Not on file  . Highest education level: Not on file  Occupational History  . Not on file  Social Needs  . Financial resource strain: Not on file  . Food insecurity    Worry: Not on file    Inability: Not on file  . Transportation needs    Medical: Not on file    Non-medical: Not on file  Tobacco Use  . Smoking status: Former Research scientist (life sciences)  . Smokeless tobacco: Never Used  Substance and Sexual Activity  . Alcohol use: No    Frequency: Never  . Drug use: No  . Sexual activity: Not Currently    Birth control/protection: Post-menopausal  Lifestyle  . Physical activity    Days per week: Not on file    Minutes per session: Not on file  . Stress: Not on file  Relationships  . Social Herbalist on  phone: Not on file    Gets together: Not on file    Attends religious service: Not on file    Active member of club or organization: Not on file    Attends meetings of clubs or organizations: Not on file    Relationship status: Not on file  . Intimate partner violence    Fear of current or ex partner: Not on file    Emotionally abused: Not on file    Physically abused: Not on file    Forced sexual activity: Not on file  Other Topics Concern  . Not on file  Social History Narrative  . Not on file    No Known Allergies  Prior to Admission medications   Medication Sig Start Date End Date Taking? Authorizing Provider  cholecalciferol (VITAMIN D) 400 units TABS tablet Take 400 Units by mouth.    [provider]  Chromium 1 MG CAPS Take 1 mg by mouth daily.    [provider]  Melatonin 1 MG TABS Take by mouth.    [provider]  Pyridoxine HCl (VITAMIN B-6) 500 MG tablet Take 500 mg by mouth daily.    [provider]    Review  of Systems  Constitutional: Negative.   HENT: Negative.   Eyes: Negative.   Respiratory: Negative.   Cardiovascular: Negative.   Gastrointestinal: Negative.   Genitourinary: Negative.   Musculoskeletal: Negative.   Skin: Negative.   Neurological: Negative.   Psychiatric/Behavioral: Negative.      Physical Exam BP 122/74   Wt 150 lb (68 kg)   BMI 26.57 kg/m  No LMP recorded. Patient is postmenopausal. Physical Exam Constitutional:      General: She is not in acute distress.    Appearance: Normal appearance. She is well-developed.  Genitourinary:     Pelvic exam was performed with patient in the lithotomy position.     Vulva, inguinal canal, urethra, bladder, vagina, uterus, right adnexa and left adnexa normal.     No posterior fourchette tenderness, injury or lesion present.     No cervical friability, lesion, bleeding or polyp.  HENT:     Head: Normocephalic and atraumatic.  Eyes:     General: No scleral  icterus.    Conjunctiva/sclera: Conjunctivae normal.  Neck:     Musculoskeletal: Normal range of motion and neck supple.  Cardiovascular:     Rate and Rhythm: Normal rate and regular rhythm.     Heart sounds: No murmur. No friction rub. No gallop.   Pulmonary:     Effort: Pulmonary effort is normal. No respiratory distress.     Breath sounds: Normal breath sounds. No wheezing or rales.  Abdominal:     General: Bowel sounds are normal. There is no distension.     Palpations: Abdomen is soft. There is no mass.     Tenderness: There is no abdominal tenderness. There is no guarding or rebound.  Musculoskeletal: Normal range of motion.  Neurological:     General: No focal deficit present.     Mental Status: She is alert and oriented to person, place, and time.     Cranial Nerves: No cranial nerve deficit.  Skin:    General: Skin is warm and dry.     Findings: No erythema.  Psychiatric:        Mood and Affect: Mood normal.        Behavior: Behavior normal.        Judgment: Judgment normal.    Female chaperone present for pelvic and breast  portions of the physical exam  Assessment: 68 y.o. [id number] female here for  1. LGSIL on Pap smear of cervix   2. Breast cancer screening by mammogram      Plan: Problem List Items Addressed This Visit    None    Visit Diagnoses    LGSIL on Pap smear of cervix    -  Primary   Relevant Orders   Cytology - PAP   Breast cancer screening by mammogram       Relevant Orders   MM 3D SCREEN BREAST BILATERAL     No concerns today. Pap smear repeated.   Mammogram ordered. Patient has no complaints today.  Follow up next year for basic annual, pending pap smear results.  15 minutes spent in face to face discussion with > 50% spent in counseling,management, and coordination of care of her LGSIL pap smear and need for routine screening mammography (it has been several years and counseling was performed).   Thomasene MohairStephen Rhaelyn Giron, MD 09/03/2018 10:43 AM

## 2018-09-05 LAB — CYTOLOGY - PAP: Diagnosis: NEGATIVE

## 2018-09-18 DIAGNOSIS — Z0000 Encounter for general adult medical examination without abnormal findings: Secondary | ICD-10-CM | POA: Diagnosis not present

## 2018-09-18 DIAGNOSIS — Z1159 Encounter for screening for other viral diseases: Secondary | ICD-10-CM | POA: Diagnosis not present

## 2018-09-18 DIAGNOSIS — Z23 Encounter for immunization: Secondary | ICD-10-CM | POA: Diagnosis not present

## 2018-09-18 DIAGNOSIS — Z136 Encounter for screening for cardiovascular disorders: Secondary | ICD-10-CM | POA: Diagnosis not present

## 2018-09-20 ENCOUNTER — Ambulatory Visit
Admission: RE | Admit: 2018-09-20 | Discharge: 2018-09-20 | Disposition: A | Discharge status: Home / Self Care | Payer: Medicare Other | Source: Ambulatory Visit | Attending: Obstetrics and Gynecology | Admitting: Obstetrics and Gynecology

## 2018-09-20 ENCOUNTER — Other Ambulatory Visit: Payer: Self-pay

## 2018-09-20 DIAGNOSIS — Z1231 Encounter for screening mammogram for malignant neoplasm of breast: Secondary | ICD-10-CM | POA: Diagnosis not present

## 2018-10-08 DIAGNOSIS — E042 Nontoxic multinodular goiter: Secondary | ICD-10-CM | POA: Diagnosis not present

## 2018-10-16 DIAGNOSIS — E042 Nontoxic multinodular goiter: Secondary | ICD-10-CM | POA: Diagnosis not present

## 2019-10-24 DIAGNOSIS — E7800 Pure hypercholesterolemia, unspecified: Secondary | ICD-10-CM | POA: Insufficient documentation

## 2019-11-18 ENCOUNTER — Other Ambulatory Visit: Payer: Self-pay | Admitting: Family Medicine

## 2019-12-16 ENCOUNTER — Other Ambulatory Visit: Payer: Self-pay

## 2019-12-16 ENCOUNTER — Other Ambulatory Visit (HOSPITAL_COMMUNITY)
Admission: RE | Admit: 2019-12-16 | Discharge: 2019-12-16 | Disposition: A | Discharge status: Home / Self Care | Payer: Medicare Other | Source: Ambulatory Visit | Attending: Obstetrics and Gynecology | Admitting: Obstetrics and Gynecology

## 2019-12-16 ENCOUNTER — Encounter: Payer: Self-pay | Admitting: Obstetrics and Gynecology

## 2019-12-16 ENCOUNTER — Ambulatory Visit (INDEPENDENT_AMBULATORY_CARE_PROVIDER_SITE_OTHER): Payer: Medicare Other | Admitting: Obstetrics and Gynecology

## 2019-12-16 VITALS — BP 126/74 | Ht 63.0 in | Wt 153.0 lb

## 2019-12-16 DIAGNOSIS — R87612 Low grade squamous intraepithelial lesion on cytologic smear of cervix (LGSIL): Secondary | ICD-10-CM | POA: Diagnosis present

## 2019-12-16 DIAGNOSIS — Z01419 Encounter for gynecological examination (general) (routine) without abnormal findings: Secondary | ICD-10-CM

## 2019-12-16 DIAGNOSIS — Z9189 Other specified personal risk factors, not elsewhere classified: Secondary | ICD-10-CM | POA: Diagnosis not present

## 2019-12-16 NOTE — Progress Notes (Signed)
Routine Annual Gynecology Examination   PCP: Marisue Ivan, MD  Chief Complaint  Patient presents with  . Annual Exam  Medicare Breast and Pelvic exam  History of Present Illness: Patient is a [name] presents for Medicare breast and pelvic exam. The patient has no complaints today.   Menopausal bleeding: denies  Menopausal symptoms: denies  Breast symptoms: denies  Last pap smear: 1 year.  Result Normal, 2019 - LGSIL, colposcopy showed CIN 1. Recommended to have pap smear this year.    Last mammogram: 09/20/2018.  Result Normal  Past Medical History:  Diagnosis Date  . Colon polyp   . Thyroid nodule   . Urinary incontinence     Past Surgical History:  Procedure Laterality Date  . COLONOSCOPY    . COLONOSCOPY WITH PROPOFOL N/A 03/16/2017   Procedure: COLONOSCOPY WITH PROPOFOL;  Surgeon: Midge Minium, MD;  Location[facility];  Service: Endoscopy;  Laterality: N/A;  . TONSILLECTOMY    . TUBAL LIGATION      Prior to Admission medications   Medication Sig Start Date End Date Taking? Authorizing Provider  cholecalciferol (VITAMIN D) 400 units TABS tablet Take 400 Units by mouth.   Yes [provider]  Chromium 1 MG CAPS Take 1 mg by mouth daily. Patient not taking: Reported on 12/16/2019    [provider]  Melatonin 1 MG TABS Take by mouth. Patient not taking: Reported on 12/16/2019    [provider]  Pyridoxine HCl (VITAMIN B-6) 500 MG tablet Take 500 mg by mouth daily. Patient not taking: Reported on 12/16/2019    [provider]    No Known Allergies   Obstetric History: [id number]  Social History   Socioeconomic History  . Marital status: Married    Spouse name: Not on file  . Number of children: Not on file  . Years of education: Not on file  . Highest education level: Not on file  Occupational History  . Not on file  Tobacco Use  . Smoking status: Former Games developer  . Smokeless tobacco: Never  Used  Vaping Use  . Vaping Use: Never used  Substance and Sexual Activity  . Alcohol use: No  . Drug use: No  . Sexual activity: Not Currently    Birth control/protection: Post-menopausal  Other Topics Concern  . Not on file  Social History Narrative  . Not on file   Social Determinants of Health   Financial Resource Strain:   . Difficulty of Paying Living Expenses: Not on file  Food Insecurity:   . Worried About Programme researcher, broadcasting/film/video in the Last Year: Not on file  . Ran Out of Food in the Last Year: Not on file  Transportation Needs:   . Lack of Transportation (Medical): Not on file  . Lack of Transportation (Non-Medical): Not on file  Physical Activity:   . Days of Exercise per Week: Not on file  . Minutes of Exercise per Session: Not on file  Stress:   . Feeling of Stress : Not on file  Social Connections:   . Frequency of Communication with Friends and Family: Not on file  . Frequency of Social Gatherings with Friends and Family: Not on file  . Attends Religious Services: Not on file  . Active Member of Clubs or Organizations: Not on file  . Attends Banker Meetings: Not on file  . Marital Status: Not on file  Intimate Partner Violence:   . Fear of Current  or Ex-Partner: Not on file  . Emotionally Abused: Not on file  . Physically Abused: Not on file  . Sexually Abused: Not on file    Family History  Problem Relation Age of Onset  . Diabetes Mellitus II Mother   . Heart attack Father   . Stroke Father   . Diabetes Mellitus II Sister   . Breast cancer Granddaughter 21    Review of Systems  Constitutional: Negative.   HENT: Negative.   Eyes: Negative.   Respiratory: Negative.   Cardiovascular: Negative.   Gastrointestinal: Negative.   Genitourinary: Negative.   Musculoskeletal: Negative.   Skin: Negative.   Neurological: Negative.   Psychiatric/Behavioral: Negative.      Physical Exam Vitals: BP 126/74   Ht 5\' 3"  (1.6 m)   Wt 153 lb  (69.4 kg)   BMI 27.10 kg/m   Physical Exam Constitutional:      General: She is not in acute distress.    Appearance: Normal appearance.  Genitourinary:     Pelvic exam was performed with patient in the lithotomy position.     Vulva, inguinal canal, urethra, bladder, vagina, cervix, uterus, right adnexa and left adnexa normal.     Uterus is anteverted.  Rectum:     Rectal exam comments: deferred.    Chest:     Breasts:        Right: No swelling, bleeding, inverted nipple, mass, nipple discharge, skin change or tenderness.        Left: No swelling, bleeding, inverted nipple, mass, nipple discharge, skin change or tenderness.  Neurological:     Mental Status: She is alert.      Female chaperone present for pelvic and breast  portions of the physical exam   Assessment and Plan:  69 y.o. [phone] female here for Medicare breast and pelvic examination  Plan: Problem List Items Addressed This Visit    None    Visit Diagnoses    Encounter for gynecological examination without abnormal finding    -  Primary   Relevant Orders   Cytology - PAP   LGSIL on Pap smear of cervix       Relevant Orders   Cytology - PAP      -- Mammogram - due 09/2020 -- pap smear collected per ASCCP guidelines due to LGSIL in 2019. May discontinue after this year, if normal.    2020, MD 12/16/2019 3:12 PM

## 2019-12-17 ENCOUNTER — Encounter: Payer: Self-pay | Admitting: Obstetrics and Gynecology

## 2019-12-27 LAB — CYTOLOGY - PAP
Comment: NEGATIVE
Diagnosis: UNDETERMINED — AB
High risk HPV: NEGATIVE

## 2020-08-29 ENCOUNTER — Emergency Department: Payer: Medicare Other

## 2020-08-29 ENCOUNTER — Other Ambulatory Visit: Payer: Self-pay

## 2020-08-29 ENCOUNTER — Emergency Department
Admission: EM | Admit: 2020-08-29 | Discharge: 2020-08-29 | Disposition: A | Discharge status: Home / Self Care | Payer: Medicare Other | Attending: Emergency Medicine | Admitting: Emergency Medicine

## 2020-08-29 DIAGNOSIS — Z87891 Personal history of nicotine dependence: Secondary | ICD-10-CM | POA: Insufficient documentation

## 2020-08-29 DIAGNOSIS — M79622 Pain in left upper arm: Secondary | ICD-10-CM | POA: Insufficient documentation

## 2020-08-29 DIAGNOSIS — M79602 Pain in left arm: Secondary | ICD-10-CM

## 2020-08-29 NOTE — Discharge Instructions (Addendum)
Wear wrist splint at night. Take meloxicam once daily for pain and inflammation.

## 2020-08-29 NOTE — ED Provider Notes (Signed)
[facility]-EMERGENCY DEPARTMENT  ____________________________________________  Time seen: Approximately 4:24 PM  I have reviewed the triage vital signs and the nursing notes.   HISTORY  Chief Complaint Arm Pain   Historian Patient     HPI Nancy Russo is a 70 y.o. female presents to the emergency department with left upper extremity pain.  Patient describes a hand contusion with hematoma that occurred after walking her dog a week ago.  Patient has since had burning pain along the first dorsal extensor compartment.  Patient states that she noticed a distended vein this morning and became concerned.  She denies recent surgery, prolonged immobilization, daily smoking or history of prior DVT or PE.  No pleuritic chest pain.  No prior history of de Quervain's in the past.  No other alleviating measures have been attempted.   Past Medical History:  Diagnosis Date   Colon polyp    Thyroid nodule    Urinary incontinence      Immunizations up to date:  Yes.     Past Medical History:  Diagnosis Date   Colon polyp    Thyroid nodule    Urinary incontinence     Patient Active Problem List   Diagnosis Date Noted   Special screening for malignant neoplasms, colon     Past Surgical History:  Procedure Laterality Date   COLONOSCOPY     COLONOSCOPY WITH PROPOFOL N/A 03/16/2017   Procedure: COLONOSCOPY WITH PROPOFOL;  Surgeon: Midge Minium, MD;  Location: [facility];  Service: Endoscopy;  Laterality: N/A;   TONSILLECTOMY     TUBAL LIGATION      Prior to Admission medications   Medication Sig Start Date End Date Taking? Authorizing Provider  cholecalciferol (VITAMIN D) 400 units TABS tablet Take 400 Units by mouth.    [provider]  Chromium 1 MG CAPS Take 1 mg by mouth daily. Patient not taking: Reported on 12/16/2019    [provider]  Melatonin 1 MG TABS Take by mouth. Patient not taking: Reported on 12/16/2019    [provider]   Pyridoxine HCl (VITAMIN B-6) 500 MG tablet Take 500 mg by mouth daily. Patient not taking: Reported on 12/16/2019    [provider]    Allergies Patient has no known allergies.  Family History  Problem Relation Age of Onset   Diabetes Mellitus II Mother    Heart attack Father    Stroke Father    Diabetes Mellitus II Sister    Breast cancer Granddaughter 99    Social History Social History   Tobacco Use   Smoking status: Former   Smokeless tobacco: Never  Building services engineer Use: Never used  Substance Use Topics   Alcohol use: No   Drug use: No     Review of Systems  Constitutional: No fever/chills Eyes:  No discharge ENT: No upper respiratory complaints. Respiratory: no cough. No SOB/ use of accessory muscles to breath Gastrointestinal:   No nausea, no vomiting.  No diarrhea.  No constipation. Musculoskeletal: Patient has left forearm pain.  Skin: Negative for rash, abrasions, lacerations, ecchymosis.    ____________________________________________   PHYSICAL EXAM:  VITAL SIGNS: ED Triage Vitals  Enc Vitals Group     BP 08/29/20 1358 (!) 156/82     Pulse Rate 08/29/20 1358 81     Resp 08/29/20 1358 16     Temp 08/29/20 1358 98.6 F (37 C)     Temp Source 08/29/20 1358 Oral     SpO2  08/29/20 1358 100 %     Weight --      Height --      Head Circumference --      Peak Flow --      Pain Score 08/29/20 1355 2     Pain Loc --      Pain Edu? --      Excl. in GC? --      Constitutional: Alert and oriented. Well appearing and in no acute distress. Eyes: Conjunctivae are normal. PERRL. EOMI. Head: Atraumatic. ENT:      Nose: No congestion/rhinnorhea.      Mouth/Throat: Mucous membranes are moist.  Neck: No stridor.  No cervical spine tenderness to palpation.  Cardiovascular: Normal rate, regular rhythm. Normal S1 and S2.  Good peripheral circulation. Respiratory: Normal respiratory effort without tachypnea or retractions. Lungs CTAB. Good  air entry to the bases with no decreased or absent breath sounds Gastrointestinal: Bowel sounds x 4 quadrants. Soft and nontender to palpation. No guarding or rigidity. No distention. Musculoskeletal: Full range of motion to all extremities. No obvious deformities noted.  Positive Finkelstein test on the left.  Palpable radial and ulnar pulses bilaterally and symmetrically.  Capillary refill less than 2 seconds on the left. Neurologic:  Normal for age. No gross focal neurologic deficits are appreciated.  Skin:  Skin is warm, dry and intact. No rash noted. Psychiatric: Mood and affect are normal for age. Speech and behavior are normal.   ____________________________________________   LABS (all labs ordered are listed, but only abnormal results are displayed)  Labs Reviewed - No data to display ____________________________________________  EKG   ____________________________________________  RADIOLOGY Geraldo Pitter, personally viewed and evaluated these images (plain radiographs) as part of my medical decision making, as well as reviewing the written report by the radiologist.    US Venous Img Upper Uni Left  Result Date: 08/29/2020 CLINICAL DATA:  Left arm swelling EXAM: LEFT UPPER EXTREMITY VENOUS DOPPLER ULTRASOUND TECHNIQUE: Gray-scale sonography with graded compression, as well as color Doppler and duplex ultrasound were performed to evaluate the upper extremity deep venous system from the level of the subclavian vein and including the jugular, axillary, basilic, radial, ulnar and upper cephalic vein. Spectral Doppler was utilized to evaluate flow at rest and with distal augmentation maneuvers. COMPARISON:  None. FINDINGS: Contralateral Subclavian Vein: Respiratory phasicity is normal and symmetric with the symptomatic side. No evidence of thrombus. Normal compressibility. Internal Jugular Vein: No evidence of thrombus. Normal compressibility, respiratory phasicity and response to  augmentation. Subclavian Vein: No evidence of thrombus. Normal compressibility, respiratory phasicity and response to augmentation. Axillary Vein: No evidence of thrombus. Normal compressibility, respiratory phasicity and response to augmentation. Cephalic Vein: No evidence of thrombus. Normal compressibility, respiratory phasicity and response to augmentation. Basilic Vein: No evidence of thrombus. Normal compressibility, respiratory phasicity and response to augmentation. Brachial Veins: No evidence of thrombus. Normal compressibility, respiratory phasicity and response to augmentation. Radial Veins: No evidence of thrombus. Normal compressibility, respiratory phasicity and response to augmentation. Ulnar Veins: No evidence of thrombus. Normal compressibility, respiratory phasicity and response to augmentation. Venous Reflux:  None visualized. Other Findings:  None visualized. IMPRESSION: No evidence of DVT within the left upper extremity. Electronically Signed   By: Alcide Clever M.D.   On: 08/29/2020 17:41    ____________________________________________    PROCEDURES  Procedure(s) performed:     Procedures     Medications - No data to display   ____________________________________________   INITIAL IMPRESSION /  ASSESSMENT AND PLAN / ED COURSE  Pertinent labs & imaging results that were available during my care of the patient were reviewed by me and considered in my medical decision making (see chart for details).       Assessment and plan:  Left forearm pain:  70 year old female presents to the emergency department with left forearm pain and concern for possible distended vein.  Patient was hypertensive at triage but vital signs were otherwise reassuring.  Patient had a positive Finkelstein test and I am concerned for de Quervain's tenosynovitis.  Will rule out DVT with venous ultrasound of the left upper extremity although suspicion is low given absence of risk  factors.  There is no evidence of DVT within the left upper extremity.  History and physical exam findings suggest de Quervain's tenosynovitis.  Patient states that she has meloxicam and a wrist splint at home.  Recommended follow-up with orthopedics as needed.  ____________________________________________  FINAL CLINICAL IMPRESSION(S) / ED DIAGNOSES  Final diagnoses:  Pain of left upper extremity      NEW MEDICATIONS STARTED DURING THIS VISIT:  ED Discharge Orders     None           This chart was dictated using voice recognition software/Dragon. Despite best efforts to proofread, errors can occur which can change the meaning. Any change was purely unintentional.     Orvil Feil, PA-C 08/29/20 1903    Gilles Chiquito, MD 08/29/20 2253

## 2020-08-29 NOTE — ED Triage Notes (Addendum)
Pt comes with c/o left hand and arm pain. Pt state this started last night. Pt states this am she woke up and it was burning and painful. Pt states a fall week ago while walking her dog.

## 2020-12-17 ENCOUNTER — Ambulatory Visit: Payer: Medicare Other | Admitting: Obstetrics and Gynecology

## 2020-12-18 ENCOUNTER — Ambulatory Visit (INDEPENDENT_AMBULATORY_CARE_PROVIDER_SITE_OTHER): Payer: Medicare Other | Admitting: Obstetrics and Gynecology

## 2020-12-18 ENCOUNTER — Encounter: Payer: Self-pay | Admitting: Obstetrics and Gynecology

## 2020-12-18 ENCOUNTER — Other Ambulatory Visit: Payer: Self-pay

## 2020-12-18 VITALS — BP 126/80 | Ht 63.0 in | Wt 153.0 lb

## 2020-12-18 DIAGNOSIS — R87612 Low grade squamous intraepithelial lesion on cytologic smear of cervix (LGSIL): Secondary | ICD-10-CM | POA: Diagnosis not present

## 2020-12-18 NOTE — Progress Notes (Signed)
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   She has no acute complaints today.  BP 126/80   Ht 5\' 3"  (1.6 m)   Wt 153 lb (69.4 kg)   BMI 27.10 kg/m   Physical Exam Constitutional:      General: She is not in acute distress.    Appearance: Normal appearance.  Genitourinary:     Urethral meatus normal.     No lesions in the vagina.     Right Labia: No rash, tenderness, lesions or skin changes.    Left Labia: No tenderness, lesions, skin changes or rash.    No inguinal adenopathy present in the right or left side.    Pelvic Tanner Score: 5/5.    No vaginal discharge, erythema, bleeding or ulceration.     No vaginal prolapse present.    No cervical motion tenderness, discharge, friability, lesion or polyp.     No uterine mass detected. HENT:     Head: Normocephalic and atraumatic.  Eyes:     General: No scleral icterus.    Conjunctiva/sclera: Conjunctivae normal.  Lymphadenopathy:     Lower Body: No right inguinal adenopathy. No left inguinal adenopathy.  Neurological:     General: No focal deficit present.     Mental Status: She is alert and oriented to person, place, and time.     Cranial Nerves: No cranial nerve deficit.  Psychiatric:        Mood and Affect: Mood normal.        Behavior: Behavior normal.        Judgment: Judgment normal.   Assessment and plan 70 y.o. female here for follow up on LGSIL pap smear  Repeat pap smear performed and sent per ASCCP guidelines. Last year HPV was negative.  Follow up for medicare breast and pelvic exam in 1 year. Due for mammogram.   66, MD, [facility], [facility] 12/18/2020 3:48 PM

## 2020-12-24 LAB — PAP IG (IMAGE GUIDED)

## 2021-02-09 ENCOUNTER — Telehealth: Payer: Self-pay

## 2021-02-09 ENCOUNTER — Other Ambulatory Visit: Payer: Self-pay | Admitting: Licensed Practical Nurse

## 2021-02-09 DIAGNOSIS — R87611 Atypical squamous cells cannot exclude high grade squamous intraepithelial lesion on cytologic smear of cervix (ASC-H): Secondary | ICD-10-CM

## 2021-02-09 NOTE — Telephone Encounter (Signed)
Pt calling; hasn't heard from her pap smear in early Dec.  [phone]

## 2021-02-09 NOTE — Progress Notes (Signed)
TC to [facility], reviewed pap results from December, ASC-H.  Per guidelines pt needs colposcopy.  Discussed recommendations with Rolene. Order placed. Jazlynn to call and schedule apt at her convenience.  Roberto Scales, CNM  [facility], [facility]  02/09/21  10:16 AM

## 2021-02-18 ENCOUNTER — Other Ambulatory Visit: Payer: Self-pay | Admitting: Obstetrics and Gynecology

## 2021-02-18 DIAGNOSIS — Z1231 Encounter for screening mammogram for malignant neoplasm of breast: Secondary | ICD-10-CM

## 2021-03-25 ENCOUNTER — Other Ambulatory Visit: Payer: Self-pay

## 2021-03-25 ENCOUNTER — Ambulatory Visit
Admission: RE | Admit: 2021-03-25 | Discharge: 2021-03-25 | Disposition: A | Discharge status: Home / Self Care | Payer: Medicare Other | Source: Ambulatory Visit | Attending: Obstetrics and Gynecology | Admitting: Obstetrics and Gynecology

## 2021-03-25 DIAGNOSIS — Z1231 Encounter for screening mammogram for malignant neoplasm of breast: Secondary | ICD-10-CM | POA: Diagnosis present

## 2021-05-08 NOTE — Discharge Instructions (Addendum)
Instructions after Hand / Wrist Surgery   James P. Hooten, Jr., M.D.  Dept. of Orthopaedics & Sports Medicine  [facility]  [address]  [place], [place]  [address]   Phone: [phone]   Fax: [phone]   DIET: Drink plenty of non-alcoholic fluids & begin a light diet. Resume your normal diet the day after surgery.  ACTIVITY:  Keep the hand elevated above the level of the elbow. Begin gently moving the fingers on a regular basis to avoid stiffness. Avoid any heavy lifting, pushing, or pulling with the operative hand. Do not drive or operate any equipment until instructed.  WOUND CARE:  Keep the splint/bandage clean and dry.  The splint and stitches will be removed in the office. Continue to use the ice packs periodically to reduce pain and swelling. You may bathe or shower after the stitches are removed at the first office visit following surgery.  MEDICATIONS: You may resume your regular medications. Please take the pain medication as prescribed. Do not take pain medication on an empty stomach. Do not drive or drink alcoholic beverages when taking pain medications.  CALL THE OFFICE FOR: Temperature above 101 degrees Excessive bleeding or drainage on the dressing. Excessive swelling, coldness, or paleness of the fingers. Persistent nausea and vomiting.  FOLLOW-UP:  You should have an appointment to return to the office in 7-10 days after surgery.   REMEMBER: R.I.C.E. = Rest, Ice, Compression, Elevation !      [facility] Department Directory         [email or web address]       [email or web address]          Cardiology  Appointments: [place] - [phone] [place] - [phone]  Endocrinology  Appointments: [place] - [phone] [place] - [phone]  Gastroenterology  Appointments: [place] - [phone] [place] - [phone]        General Surgery   Appointments: [place] -  [phone]  Internal Medicine/Family Medicine  Appointments: [place] - [phone] [place] - [phone] [place] - [phone]  Metabolic and Weigh Loss Surgery  Appointments: [place] - [phone]        Neurology  Appointments: [place] - [phone] [place] - [phone]  Neurosurgery  Appointments: [place] - [phone]  Obstetrics & Gynecology  Appointments: [place] - [phone] [place] - [phone]        Pediatrics  Appointments: [place] - [phone] [place] - [phone]  Physiatry  Appointments: [place] -[phone]  Physical Therapy  Appointments: [place] - [phone] [place] - [phone]        Podiatry  Appointments: [place] - [phone] [place] - [phone]  Pulmonology  Appointments: [place] - [phone]  Rheumatology  Appointments: [place] - [phone]        [place] Location: [facility]  [address] [place], [place]  [address]  [place] Location: [facility] [address] [place], [place]  [address]  [place] Location: [facility] [address] [place], [place]  [address]            AMBULATORY SURGERY  DISCHARGE INSTRUCTIONS   The drugs that you were given will stay in your system until tomorrow so for the next 24 hours you should not:  Drive an automobile Make any legal decisions Drink any alcoholic beverage   You may resume regular meals tomorrow.  Today it is better to start with liquids and gradually work up to solid foods.  You may eat anything you prefer, but it is better to start with liquids, then soup and crackers, and gradually work up to solid foods.     Please notify your doctor immediately if you have any unusual bleeding, trouble breathing, redness and pain at the surgery site, drainage, fever, or pain not relieved by medication.    Additional Instructions:   Please contact your physician with any problems or Same Day Surgery at [phone],  Monday through Friday 6 am to 4 pm, or [place] [facility] [place] Main number at [phone]. 

## 2021-05-09 DIAGNOSIS — M654 Radial styloid tenosynovitis [de Quervain]: Secondary | ICD-10-CM | POA: Insufficient documentation

## 2021-05-17 ENCOUNTER — Encounter: Payer: Self-pay | Admitting: Urgent Care

## 2021-05-17 ENCOUNTER — Other Ambulatory Visit
Admission: RE | Admit: 2021-05-17 | Discharge: 2021-05-17 | Disposition: A | Discharge status: Home / Self Care | Payer: Medicare Other | Source: Ambulatory Visit | Attending: Orthopedic Surgery | Admitting: Orthopedic Surgery

## 2021-05-17 ENCOUNTER — Other Ambulatory Visit: Payer: Self-pay

## 2021-05-17 HISTORY — DX: Headache, unspecified: R51.9

## 2021-05-17 NOTE — Patient Instructions (Addendum)
Your procedure is scheduled on: 05/24/21 - Monday ?Report to the Registration Desk on the 1st floor of the [facility]. ?To find out your arrival time, please call [phone] between 1PM - 3PM on: 05/21/21 - Friday ?If your arrival time is 6:00 am, do not arrive prior to that time as the [facility] entrance doors do not open until 6:00 am. ? ?REMEMBER: ?Instructions that are not followed completely may result in serious medical risk, up to and including death; or upon the discretion of your surgeon and anesthesiologist your surgery may need to be rescheduled. ? ?Do not eat food after midnight the night before surgery.  ?No gum chewing, lozengers or hard candies. ? ?You may however, drink CLEAR liquids up to 2 hours before you are scheduled to arrive for your surgery. Do not drink anything within 2 hours of your scheduled arrival time. ? ?Clear liquids include: ?- water  ?- apple juice without pulp ?- gatorade (not RED colors) ?- black coffee or tea (Do NOT add milk or creamers to the coffee or tea) ?Do NOT drink anything that is not on this list. ? ?In addition, your doctor has ordered for you to drink the provided  ?Ensure Pre-Surgery Clear Carbohydrate Drink  ?Drinking this carbohydrate drink up to two hours before surgery helps to reduce insulin resistance and improve patient outcomes. Please complete drinking 2 hours prior to scheduled arrival time. ? ?TAKE THESE MEDICATIONS THE MORNING OF SURGERY WITH A SIP OF WATER: NONE ? ?One week prior to surgery: ?Stop Anti-inflammatories (NSAIDS) such as Advil, Aleve, Ibuprofen, Motrin, Naproxen, Naprosyn and Aspirin based products such as Excedrin, Goodys Powder, BC Powder. ? ?Stop ANY OVER THE COUNTER supplements until after surgery. ? ?You may however, continue to take Tylenol if needed for pain up until the day of surgery. ? ?No Alcohol for 24 hours before or after surgery. ? ?No Smoking including e-cigarettes for 24 hours prior to surgery.  ?No chewable  tobacco products for at least 6 hours prior to surgery.  ?No nicotine patches on the day of surgery. ? ?Do not use any "recreational" drugs for at least a week prior to your surgery.  ?Please be advised that the combination of cocaine and anesthesia may have negative outcomes, up to and including death. ?If you test positive for cocaine, your surgery will be cancelled. ? ?On the morning of surgery brush your teeth with toothpaste and water, you may rinse your mouth with mouthwash if you wish. ?Do not swallow any toothpaste or mouthwash. ? ?Use CHG Soap or wipes as directed on instruction sheet. ? ?Do not wear jewelry, make-up, hairpins, clips or nail polish. ? ?Do not wear lotions, powders, or perfumes.  ? ?Do not shave body from the neck down 48 hours prior to surgery just in case you cut yourself which could leave a site for infection.  ?Also, freshly shaved skin may become irritated if using the CHG soap. ? ?Contact lenses, hearing aids and dentures may not be worn into surgery. ? ?Do not bring valuables to the hospital. [facility] is not responsible for any missing/lost belongings or valuables.  ? ?Notify your doctor if there is any change in your medical condition (cold, fever, infection). ? ?Wear comfortable clothing (specific to your surgery type) to the hospital. ? ?After surgery, you can help prevent lung complications by doing breathing exercises.  ?Take deep breaths and cough every 1-2 hours. Your doctor may order a device called an Incentive Spirometer to help  you take deep breaths. ?When coughing or sneezing, hold a pillow firmly against your incision with both hands. This is called ?splinting.? Doing this helps protect your incision. It also decreases belly discomfort. ? ?If you are being admitted to the hospital overnight, leave your suitcase in the car. ?After surgery it may be brought to your room. ? ?If you are being discharged the day of surgery, you will not be allowed to drive home. ?You will  need a responsible adult (18 years or older) to drive you home and stay with you that night.  ? ?If you are taking public transportation, you will need to have a responsible adult (18 years or older) with you. ?Please confirm with your physician that it is acceptable to use public transportation.  ? ?Please call the Pre-admissions Testing Dept. at [phone] if you have any questions about these instructions. ? ?Surgery Visitation Policy: ? ?Patients undergoing a surgery or procedure may have two family members or support persons with them as long as the person is not COVID-19 positive or experiencing its symptoms.  ? ?Inpatient Visitation:   ? ?Visiting hours are 7 a.m. to 8 p.m. ?Up to four visitors are allowed at one time in a patient room, including children. The visitors may rotate out with other people during the day. One designated support person (adult) may remain overnight.  ?

## 2021-05-18 ENCOUNTER — Other Ambulatory Visit
Admission: RE | Admit: 2021-05-18 | Discharge: 2021-05-18 | Disposition: A | Discharge status: Home / Self Care | Payer: Medicare Other | Source: Ambulatory Visit | Attending: Orthopedic Surgery | Admitting: Orthopedic Surgery

## 2021-05-18 DIAGNOSIS — Z01810 Encounter for preprocedural cardiovascular examination: Secondary | ICD-10-CM | POA: Insufficient documentation

## 2021-05-23 NOTE — H&P (Signed)
ORTHOPAEDIC HISTORY & PHYSICAL ?Peri Kreft, Adelina Mings., MD - 05/03/2021 3:00 PM EDT ?Formatting of this note is different from the original. ?Images from the original note were not included. ?Chief Complaint: ?Chief Complaint  ?Patient presents with  ? Left <9>De Quervain's disease (tenosynovitis)  ? ?Reason for Visit: ?The patient is a 71 y.o. right-hand dominant female who presents today for reevaluation of her left hand and wrist. She had the onset of pain and "burning" along the radial aspect of her left wrist in August 2022 after walking her dog on a leash. She localizes the most severe pain over the 1st dorsal compartment of the wrist. The pain is aggravated by gripping items with the thumb. She has not appreciated any significant improvement despite use of a thumb spica splint, corticosteroid injection, activity modification, or physical therapy. She denies any gross numbness or weakness. ? ?Medications: ?Current Outpatient Medications  ?Medication Sig Dispense Refill  ? cholecalciferol, vitamin D3, (VITAMIN D3) 125 mcg (5,000 unit) tablet Take 5,000 Units by mouth once a week  ? chromium picolinate 400 mcg Tab Take 800 mcg by mouth once daily  ? cyanocobalamin 2000 MCG tablet Take 1,000 mcg by mouth once daily  ? magnesium carb,citrate,oxide (MAGNESIUM COMPLEX ORAL) Take 500 mg by mouth once daily  ? pyridoxine, vitamin B6, (VITAMIN B-6) 50 MG tablet Take 300 mg by mouth once daily  ? RED YEAST RICE ORAL Take 1 tablet by mouth once daily as needed  ? zinc 50 mg Tab Take 1 tablet by mouth once daily  ? ?No current facility-administered medications for this visit.  ? ?Allergies: ?No Known Allergies ? ?Past Medical History: ?Past Medical History:  ?Diagnosis Date  ? History of chicken pox  ? Multinodular goiter  ? Osteopenia  ? ?Past Surgical History: ?Past Surgical History:  ?Procedure Laterality Date  ? TONSILLECTOMY 1965  ? TUBAL LIGATION 1979  ? CYSTOSCOPY 1962 1965  ? WISDOM TEETH  ? ?Social  History: ?Social History  ? ?Socioeconomic History  ? Marital status: Married  ?Spouse name: Lorella Nimrod  ? Number of children: 4  ? Years of education: 20  ? Highest education level: High school graduate  ?Occupational History  ? Occupation: Retired  ?Tobacco Use  ? Smoking status: Never  ? Smokeless tobacco: Never  ?Vaping Use  ? Vaping Use: Never used  ?Substance and Sexual Activity  ? Alcohol use: Not Currently  ? Drug use: Never  ? Sexual activity: Defer  ?Partners: Male  ?Social History Narrative  ?Religious Affiliation: Christian/B[employer]ist  ? ?Family History: ?Family History  ?Problem Relation Age of Onset  ? Diabetes type II Mother  ? Heart disease Father  ? Myocardial Infarction (Heart attack) Father  ? Diabetes type II Sister  ? Coronary aneurysm Sister  ? Diabetes type II Brother  ? No Known Problems Brother  ? Diabetes Maternal Grandmother  ? Diabetes Paternal Grandmother  ? Stroke Paternal Grandmother  ? ?Review of Systems: ?A comprehensive 14 point ROS was performed, reviewed, and the pertinent orthopaedic findings are documented in the HPI. ? ?Exam ?BP 134/80  Ht 160 cm (5\' 3" )  Wt 71.2 kg (157 lb)  BMI 27.81 kg/m?  ? ?General:  ?Well-developed, well-nourished female seen in no acute distress.  ? ?HEENT:  ?Atraumatic, normocephalic. Pupils are equal and reactive to light. Extraocular motion is intact. Sclera are clear. Oropharynx is clear with moist mucosa. ? ?Neck: ?Good range of motion. No tenderness to palpation. ?Spurling`s test and L'hermitte's sign are negative. ? ?  Lungs:  ?Clear to auscultation bilaterally. ? ?Cardiovascular:  ?Regular rate and rhythm. Normal S1, S2. No murmur . No appreciable gallops or rubs. Peripheral pulses are palpable.  ? ?Extremities:  ?Normal shoulder contour.  ?Good range of motion and stability of the shoulders, elbows, and wrists. ?Tinel`s test at the elbow is negative. ? ?Left hand:  ?Tenderness: Tender over the 1st dorsal compartment of the wrist ?Erythema:  negative ?Swelling: negative ?Capillary Refill: normal ?Thenar atrophy: negative ?Intrinsic wasting: negative ?Grip strength: fair to good grip strength ?Pincer strength: fair to good pincer strength ?Tinel`s test: negative ?Phalen`s test: negative ?Triggering: No gross triggering or locking of the digits ?Finkelstein`s test: Positive ?Range of motion: Good range of motion of the digits ? ?Neurologic:  ?Awake, alert , and oriented.  ?Sensory function is intact to pinprick and light touch. ?Motor strength is judged to be 5/5 except as noted above. ?No clonus or tremor.  ?Motor coordination is within normal limits. ? ?Radiographs: ?I reviewed the left hand and wrist radiographs that were performed at [facility] on 09/18/2020. Degenerative changes are noted to the thumb CMC joint. No evidence of fracture. ? ?Impression: ?Left deQuervain stenosing tenosynovitis ? ?Plan:  ?The findings were discussed in detail with the patient. ?The patient was given informational material on deQuervain release. ?Conservative treatment options were reviewed with the patient. We discussed the risks and benefits of surgical intervention. The "burning" sensation may be related to a traction injury to the superficial radial nerve. Release of the first dorsal compartment of the wrist may have limited effect on the causalgias and would have no effect on degenerative changes of the thumb CMC joint. The usual perioperative course was also discussed in detail. The patient expressed understanding of the risks and benefits of surgical intervention and would like to proceed with plans for left deQuervain release. ? ?I spent a total of 45 minutes in both face-to-face and non-face-to-face activities, excluding procedures performed, for this visit on the date of this encounter.  ? ?MEDICAL CLEARANCE: ?Per anesthesiology ?ACTIVITIES: ?As tolerated. ?WORK STATUS: ?Not applicable. ?THERAPY: ?None ?MEDICATIONS: ?Requested Prescriptions  ? ?No  prescriptions requested or ordered in this encounter  ? ?FOLLOW-UP: ?Return for postoperative follow-up. ? ?Kylle Lall P. Angie Fava., M.D.  ?Electronically signed by Shari Heritage., MD at 05/09/2021 8:01 AM EDT ? ?

## 2021-05-24 ENCOUNTER — Other Ambulatory Visit: Payer: Self-pay

## 2021-05-24 ENCOUNTER — Ambulatory Visit
Admission: RE | Admit: 2021-05-24 | Discharge: 2021-05-24 | Disposition: A | Discharge status: Home / Self Care | Payer: Medicare Other | Attending: Orthopedic Surgery | Admitting: Orthopedic Surgery

## 2021-05-24 ENCOUNTER — Ambulatory Visit: Payer: Medicare Other | Admitting: Anesthesiology

## 2021-05-24 ENCOUNTER — Encounter
Admission: RE | Disposition: A | Discharge status: Home / Self Care | Payer: Self-pay | Source: Home / Self Care | Attending: Orthopedic Surgery

## 2021-05-24 ENCOUNTER — Encounter: Payer: Self-pay | Admitting: Orthopedic Surgery

## 2021-05-24 DIAGNOSIS — M654 Radial styloid tenosynovitis [de Quervain]: Secondary | ICD-10-CM | POA: Insufficient documentation

## 2021-05-24 HISTORY — PX: DORSAL COMPARTMENT RELEASE: SHX5039

## 2021-05-24 SURGERY — RELEASE, FIRST DORSAL COMPARTMENT, HAND
Anesthesia: General | Site: Wrist | Laterality: Left

## 2021-05-24 MED ORDER — HYDROCODONE-ACETAMINOPHEN 7.5-325 MG PO TABS
1.0000 | ORAL_TABLET | ORAL | Status: DC | PRN
  Filled 2021-05-24: qty 2

## 2021-05-24 MED ORDER — FENTANYL CITRATE (PF) 100 MCG/2ML IJ SOLN: 25.0000 ug | INTRAMUSCULAR | Status: DC | PRN

## 2021-05-24 MED ORDER — ORAL CARE MOUTH RINSE: 15.0000 mL | Freq: Once | OROMUCOSAL | Status: AC

## 2021-05-24 MED ORDER — ACETAMINOPHEN 325 MG PO TABS: 325.0000 mg | ORAL_TABLET | Freq: Four times a day (QID) | ORAL | Status: DC | PRN

## 2021-05-24 MED ORDER — ONDANSETRON HCL 4 MG/2ML IJ SOLN: 4.0000 mg | Freq: Four times a day (QID) | INTRAMUSCULAR | Status: DC | PRN

## 2021-05-24 MED ORDER — ONDANSETRON HCL 4 MG/2ML IJ SOLN: 4.0000 mg | Freq: Once | INTRAMUSCULAR | Status: DC | PRN

## 2021-05-24 MED ORDER — LIDOCAINE HCL (CARDIAC) PF 100 MG/5ML IV SOSY
PREFILLED_SYRINGE | INTRAVENOUS | Status: DC | PRN
  Administered 2021-05-24: 100 mg via INTRAVENOUS

## 2021-05-24 MED ORDER — LACTATED RINGERS IV SOLN: INTRAVENOUS | Status: DC

## 2021-05-24 MED ORDER — CHLORHEXIDINE GLUCONATE 0.12 % MT SOLN
OROMUCOSAL | Status: AC
  Administered 2021-05-24: 15 mL via OROMUCOSAL
  Filled 2021-05-24: qty 15

## 2021-05-24 MED ORDER — ACETAMINOPHEN 10 MG/ML IV SOLN: 1000.0000 mg | Freq: Once | INTRAVENOUS | Status: DC | PRN

## 2021-05-24 MED ORDER — ONDANSETRON HCL 4 MG PO TABS: 4.0000 mg | ORAL_TABLET | Freq: Four times a day (QID) | ORAL | Status: DC | PRN

## 2021-05-24 MED ORDER — CHLORHEXIDINE GLUCONATE 0.12 % MT SOLN: 15.0000 mL | Freq: Once | OROMUCOSAL | Status: AC

## 2021-05-24 MED ORDER — OXYCODONE HCL 5 MG PO TABS: 5.0000 mg | ORAL_TABLET | Freq: Once | ORAL | Status: DC | PRN

## 2021-05-24 MED ORDER — DEXAMETHASONE SODIUM PHOSPHATE 10 MG/ML IJ SOLN
INTRAMUSCULAR | Status: AC
  Filled 2021-05-24: qty 1

## 2021-05-24 MED ORDER — FAMOTIDINE 20 MG PO TABS: 20.0000 mg | ORAL_TABLET | Freq: Once | ORAL | Status: AC

## 2021-05-24 MED ORDER — DEXMEDETOMIDINE HCL IN NACL 200 MCG/50ML IV SOLN
INTRAVENOUS | Status: DC | PRN
  Administered 2021-05-24: 8 ug via INTRAVENOUS

## 2021-05-24 MED ORDER — FENTANYL CITRATE (PF) 100 MCG/2ML IJ SOLN
INTRAMUSCULAR | Status: DC | PRN
  Administered 2021-05-24 (×2): 25 ug via INTRAVENOUS
  Administered 2021-05-24: 50 ug via INTRAVENOUS

## 2021-05-24 MED ORDER — 0.9 % SODIUM CHLORIDE (POUR BTL) OPTIME
TOPICAL | Status: DC | PRN
  Administered 2021-05-24: 500 mL

## 2021-05-24 MED ORDER — FENTANYL CITRATE (PF) 100 MCG/2ML IJ SOLN
INTRAMUSCULAR | Status: AC
  Filled 2021-05-24: qty 2

## 2021-05-24 MED ORDER — MORPHINE SULFATE (PF) 2 MG/ML IV SOLN: 0.5000 mg | INTRAVENOUS | Status: DC | PRN

## 2021-05-24 MED ORDER — OXYCODONE HCL 5 MG/5ML PO SOLN: 5.0000 mg | Freq: Once | ORAL | Status: DC | PRN

## 2021-05-24 MED ORDER — ONDANSETRON HCL 4 MG/2ML IJ SOLN
INTRAMUSCULAR | Status: AC
Start: 2021-05-24 — End: ?
  Filled 2021-05-24: qty 2

## 2021-05-24 MED ORDER — PROPOFOL 10 MG/ML IV BOLUS
INTRAVENOUS | Status: DC | PRN
  Administered 2021-05-24: 150 mg via INTRAVENOUS
  Administered 2021-05-24: 50 mg via INTRAVENOUS

## 2021-05-24 MED ORDER — ACETAMINOPHEN 10 MG/ML IV SOLN
INTRAVENOUS | Status: AC
  Filled 2021-05-24: qty 100

## 2021-05-24 MED ORDER — METOCLOPRAMIDE HCL 5 MG/ML IJ SOLN: 5.0000 mg | Freq: Three times a day (TID) | INTRAMUSCULAR | Status: DC | PRN

## 2021-05-24 MED ORDER — BUPIVACAINE HCL (PF) 0.25 % IJ SOLN
INTRAMUSCULAR | Status: AC
  Filled 2021-05-24: qty 30

## 2021-05-24 MED ORDER — ONDANSETRON HCL 4 MG/2ML IJ SOLN
INTRAMUSCULAR | Status: DC | PRN
  Administered 2021-05-24: 4 mg via INTRAVENOUS

## 2021-05-24 MED ORDER — BUPIVACAINE HCL (PF) 0.25 % IJ SOLN
INTRAMUSCULAR | Status: DC | PRN
  Administered 2021-05-24: 4 mL

## 2021-05-24 MED ORDER — HYDROCODONE-ACETAMINOPHEN 5-325 MG PO TABS: 1.0000 | ORAL_TABLET | ORAL | Status: DC | PRN

## 2021-05-24 MED ORDER — PROPOFOL 10 MG/ML IV BOLUS
INTRAVENOUS | Status: AC
  Filled 2021-05-24: qty 20

## 2021-05-24 MED ORDER — FAMOTIDINE 20 MG PO TABS
ORAL_TABLET | ORAL | Status: AC
  Administered 2021-05-24: 20 mg via ORAL
  Filled 2021-05-24: qty 1

## 2021-05-24 MED ORDER — METOCLOPRAMIDE HCL 10 MG PO TABS: 5.0000 mg | ORAL_TABLET | Freq: Three times a day (TID) | ORAL | Status: DC | PRN

## 2021-05-24 MED ORDER — HYDROCODONE-ACETAMINOPHEN 5-325 MG PO TABS: 1.0000 | ORAL_TABLET | ORAL | 0 refills | Status: AC | PRN

## 2021-05-24 MED ORDER — ACETAMINOPHEN 10 MG/ML IV SOLN
INTRAVENOUS | Status: DC | PRN
  Administered 2021-05-24: 1000 mg via INTRAVENOUS

## 2021-05-24 SURGICAL SUPPLY — 35 items
BLADE SURG 15 STRL LF DISP TIS (BLADE) ×1 IMPLANT
BLADE SURG 15 STRL SS (BLADE) ×2
BNDG CMPR STD VLCR NS LF 5.8X2 (GAUZE/BANDAGES/DRESSINGS) ×1
BNDG CONFORM 2 STRL LF (GAUZE/BANDAGES/DRESSINGS) ×2 IMPLANT
BNDG ELASTIC 2X5.8 VLCR NS LF (GAUZE/BANDAGES/DRESSINGS) ×2 IMPLANT
BNDG ELASTIC 3X5.8 VLCR STR LF (GAUZE/BANDAGES/DRESSINGS) ×1 IMPLANT
BNDG ESMARK 4X12 TAN STRL LF (GAUZE/BANDAGES/DRESSINGS) ×2 IMPLANT
CUFF TOURN SGL QUICK 18X4 (TOURNIQUET CUFF) ×2 IMPLANT
DRSG DERMACEA 8X12 NADH (GAUZE/BANDAGES/DRESSINGS) ×1 IMPLANT
DURAPREP 26ML APPLICATOR (WOUND CARE) ×2 IMPLANT
ELECT CAUTERY NEEDLE TIP 1.0 (MISCELLANEOUS) ×2
ELECT REM PT RETURN 9FT ADLT (ELECTROSURGICAL) ×2
ELECTRODE CAUTERY NEDL TIP 1.0 (MISCELLANEOUS) ×1 IMPLANT
ELECTRODE REM PT RTRN 9FT ADLT (ELECTROSURGICAL) ×1 IMPLANT
GAUZE SPONGE 4X4 12PLY STRL (GAUZE/BANDAGES/DRESSINGS) ×2 IMPLANT
GLOVE SURG ENC MOIS LTX SZ8 (GLOVE) ×2 IMPLANT
GLOVE SURG ENC TEXT LTX SZ7.5 (GLOVE) ×2 IMPLANT
GOWN STRL REUS W/ TWL LRG LVL3 (GOWN DISPOSABLE) ×2 IMPLANT
GOWN STRL REUS W/TWL LRG LVL3 (GOWN DISPOSABLE) ×4
KIT TURNOVER KIT A (KITS) ×2 IMPLANT
MANIFOLD NEPTUNE II (INSTRUMENTS) ×2 IMPLANT
NDL HYPO 25X1 1.5 SAFETY (NEEDLE) ×1 IMPLANT
NEEDLE HYPO 25X1 1.5 SAFETY (NEEDLE) ×2 IMPLANT
NS IRRIG 500ML POUR BTL (IV SOLUTION) ×2 IMPLANT
PACK EXTREMITY ARMC (MISCELLANEOUS) ×2 IMPLANT
PAD CAST 4YDX4 CTTN HI CHSV (CAST SUPPLIES) IMPLANT
PAD CAST CTTN 4X4 STRL (SOFTGOODS) ×1 IMPLANT
PADDING CAST COTTON 4X4 STRL (CAST SUPPLIES) ×2
PADDING CAST COTTON 4X4 STRL (SOFTGOODS) ×2
SPLINT CAST 1 STEP 3X12 (MISCELLANEOUS) ×2 IMPLANT
STOCKINETTE 48X4 2 PLY STRL (GAUZE/BANDAGES/DRESSINGS) ×1 IMPLANT
STOCKINETTE STRL 4IN 9604848 (GAUZE/BANDAGES/DRESSINGS) ×2 IMPLANT
SUT ETHILON 5-0 FS-2 18 BLK (SUTURE) ×2 IMPLANT
SYR 10ML LL (SYRINGE) ×2 IMPLANT
WATER STERILE IRR 500ML POUR (IV SOLUTION) ×2 IMPLANT

## 2021-05-24 NOTE — Transfer of Care (Addendum)
Immediate Anesthesia Transfer of Care Note ? ?Patient: Nancy Russo ? ?Procedure(s) Performed: RELEASE DORSAL COMPARTMENT (DEQUERVAIN) (Left: Wrist) ? ?Patient Location: PACU ? ?Anesthesia Type:General ? ?Level of Consciousness: drowsy ? ?Airway & Oxygen Therapy: Patient Spontanous Breathing ? ?Post-op Assessment: Report given to RN ? ?Post vital signs: Reviewed stable ? ?Last Vitals:  ?Vitals Value Taken Time  ?BP    ?Temp    ?Pulse    ?Resp    ?SpO2    ? ? ?Last Pain:  ?Vitals:  ? 05/24/21 1416  ?TempSrc: Temporal  ?PainSc: 0-No pain  ?   ? ?  ? ?Complications: No notable events documented. ?

## 2021-05-24 NOTE — Anesthesia Procedure Notes (Signed)
Procedure Name: LMA Insertion ?Date/Time: 05/24/2021 3:34 PM ?Performed by: Carter Kitten, CRNA ?Pre-anesthesia Checklist: Patient identified, Patient being monitored, Timeout performed, Emergency Drugs available and Suction available ?Patient Re-evaluated:Patient Re-evaluated prior to induction ?Oxygen Delivery Method: Circle system utilized ?Preoxygenation: Pre-oxygenation with 100% oxygen ?Induction Type: IV induction ?Ventilation: Mask ventilation without difficulty ?LMA: LMA inserted ?LMA Size: 3.0 ?Tube type: Oral ?Number of attempts: 1 ?Placement Confirmation: positive ETCO2 and breath sounds checked- equal and bilateral ?Tube secured with: Tape ?Dental Injury: Teeth and Oropharynx as per pre-operative assessment  ? ? ? ? ?

## 2021-05-24 NOTE — H&P (Signed)
The patient has been re-examined, and the chart reviewed, and there have been no interval changes to the documented history and physical.    The risks, benefits, and alternatives have been discussed at length. The patient expressed understanding of the risks benefits and agreed with plans for surgical intervention.  Nakeshia Waldeck P. Clarinda Obi, Jr. M.D.    

## 2021-05-24 NOTE — Anesthesia Postprocedure Evaluation (Signed)
Anesthesia Post Note ? ?Patient: Athene Schwartzman ? ?Procedure(s) Performed: RELEASE DORSAL COMPARTMENT (DEQUERVAIN) (Left: Wrist) ? ?Patient location during evaluation: PACU ?Anesthesia Type: General ?Level of consciousness: awake and alert ?Pain management: pain level controlled ?Vital Signs Assessment: post-procedure vital signs reviewed and stable ?Respiratory status: spontaneous breathing, nonlabored ventilation, respiratory function stable and patient connected to nasal cannula oxygen ?Cardiovascular status: blood pressure returned to baseline and stable ?Postop Assessment: no apparent nausea or vomiting ?Anesthetic complications: no ? ? ?No notable events documented. ? ? ?Last Vitals:  ?Vitals:  ? 05/24/21 1715 05/24/21 1727  ?BP: (!) 143/66 (!) 151/70  ?Pulse: 63 65  ?Resp: 12 14  ?Temp: (!) 36.2 ?C (!) 36.4 ?C  ?SpO2: 96% 98%  ?  ?Last Pain:  ?Vitals:  ? 05/24/21 1727  ?TempSrc: Temporal  ?PainSc: 0-No pain  ? ? ?  ?  ?  ?  ?  ?  ? ?Cleda Mccreedy Jeanetta Alonzo ? ? ? ? ?

## 2021-05-24 NOTE — Op Note (Signed)
OPERATIVE NOTE ? ?DATE OF SURGERY:  05/24/2021 ? ?PATIENT NAME:  Nancy Russo   ?DOB: [birth year]  ?MRN: [id number] ? ?PRE-OPERATIVE DIAGNOSIS: Left DeQuervain's stenosing tenosynovitis ? ?POST-OPERATIVE DIAGNOSIS:  Same ? ?PROCEDURE:  Left DeQuervain's release ? ?SURGEON:  Marciano Sequin. M.D. ? ?ANESTHESIA: general ? ?ESTIMATED BLOOD LOSS: Minimal ? ?FLUIDS REPLACED: 400 mL of crystalloid ? ?TOURNIQUET TIME: 29 minutes ? ?DRAINS: None ? ?INDICATIONS FOR SURGERY: Nancy Russo is a 71 y.o. year old female with a  history of pain over the first dorsal compartment of the left wrist. The patient had not seen any significant improvement despite conservative nonsurgical intervention. After discussion of the risks and benefits of surgical intervention, the patient expressed understanding of the risks benefits and agree with plans for release of the first dorsal compartment of the wrist (DeQuervain's release).  ? ?PROCEDURE IN DETAIL: The patient was brought into the operating room and after adequate general anesthesia, a tourniquet was placed on the patient's left upper arm.The left hand and arm were prepped with alcohol and Duraprep and draped in the usual sterile fashion. A "time-out" was performed as per usual protocol. The hand and forearm were exsanguinated using an Esmarch and the tourniquet was inflated to 250 mmHg. Loupe magnification was used throughout the procedure. An incision was made over the first dorsal compartment approximately 0.5 centimeters proximal to the tip of the radial styloid. Care was taken to identify and protect the radial sensory branches. Dissection was carried down to the annular ligament. The annular ligament was sharply incised and there was noted to be some thickening and inflammatory changes to the tenosynovium which was subsequently debrided. The extensor pollicis brevis and abductor pollicis longus were elevated out of the wound with a hook to document complete decompression.  Free and independent movement of the tendons was noticed by passively moving the thumb. The tourniquet was deflated after total tourniquet time of 29 minutes. Hemostasis was achieved using electrocautery. The skin was then re-approximated with interrupted sutures of #5-0 nylon. A sterile compression dressing was applied  ? ?The patient tolerated the procedure well and was transported to the PACU in stable condition. ? ?Roi Jafari P. Holley Bouche., M.D.  ?

## 2021-05-24 NOTE — Anesthesia Preprocedure Evaluation (Signed)
Anesthesia Evaluation  ?Patient identified by MRN, date of birth, ID band ?Patient awake ? ? ? ?Reviewed: ?Allergy & Precautions, NPO status , Patient's Chart, lab work & pertinent test results ? ?History of Anesthesia Complications ?Negative for: history of anesthetic complications ? ?Airway ?Mallampati: II ? ?TM Distance: >3 FB ?Neck ROM: Full ? ? ? Dental ? ?(+) Partial Upper ?  ?Pulmonary ?neg pulmonary ROS, neg sleep apnea, neg COPD, Patient abstained from smoking.Not current smoker,  ?  ?Pulmonary exam normal ?breath sounds clear to auscultation ? ? ? ? ? ? Cardiovascular ?Exercise Tolerance: Good ?METS(-) hypertension(-) CAD and (-) Past MI negative cardio ROS ? ?(-) dysrhythmias  ?Rhythm:Regular Rate:Normal ?- Systolic murmurs ? ?  ?Neuro/Psych ? Headaches, negative psych ROS  ? GI/Hepatic ?neg GERD  ,(+)  ?  ? (-) substance abuse ? ,   ?Endo/Other  ?neg diabetes ? Renal/GU ?negative Renal ROS  ? ?  ?Musculoskeletal ? ? Abdominal ?  ?Peds ? Hematology ?  ?Anesthesia Other Findings ?Past Medical History: ?No date: Colon polyp ?No date: Headache ?    Comment:  pre menopausal ?No date: Thyroid nodule ?No date: Urinary incontinence ? Reproductive/Obstetrics ? ?  ? ? ? ? ? ? ? ? ? ? ? ? ? ?  ?  ? ? ? ? ? ? ? ? ?Anesthesia Physical ?Anesthesia Plan ? ?ASA: 2 ? ?Anesthesia Plan: General  ? ?Post-op Pain Management: Ofirmev IV (intra-op)* and Toradol IV (intra-op)*  ? ?Induction: Intravenous ? ?PONV Risk Score and Plan: Ondansetron, Dexamethasone and Treatment may vary due to age or medical condition ? ?Airway Management Planned: LMA ? ?Additional Equipment: None ? ?Intra-op Plan:  ? ?Post-operative Plan: Extubation in OR ? ?Informed Consent: I have reviewed the patients History and Physical, chart, labs and discussed the procedure including the risks, benefits and alternatives for the proposed anesthesia with the patient or authorized representative who has indicated his/her  understanding and acceptance.  ? ? ? ?Dental advisory given ? ?Plan Discussed with: CRNA and Surgeon ? ?Anesthesia Plan Comments: (Discussed risks of anesthesia with patient, including PONV, sore throat, lip/dental/eye damage. Rare risks discussed as well, such as cardiorespiratory and neurological sequelae, and allergic reactions. Discussed the role of CRNA in patient's perioperative care. Patient understands.)  ? ? ? ? ? ? ?Anesthesia Quick Evaluation ? ?

## 2021-05-25 ENCOUNTER — Encounter: Payer: Self-pay | Admitting: Orthopedic Surgery

## 2022-02-07 ENCOUNTER — Telehealth: Payer: Self-pay | Admitting: Gastroenterology

## 2022-02-07 NOTE — Telephone Encounter (Signed)
Patient calling to schedule repeat colonoscopy.  

## 2022-02-07 NOTE — Telephone Encounter (Signed)
Spoken to patient. She is at the store and checking out. Inform patient that I will call her in the morning which will be a better time for patient.

## 2022-02-08 ENCOUNTER — Other Ambulatory Visit: Payer: Self-pay | Admitting: *Deleted

## 2022-02-08 ENCOUNTER — Telehealth: Payer: Self-pay | Admitting: *Deleted

## 2022-02-08 DIAGNOSIS — Z86010 Personal history of colonic polyps: Secondary | ICD-10-CM

## 2022-02-08 MED ORDER — NA SULFATE-K SULFATE-MG SULF 17.5-3.13-1.6 GM/177ML PO SOLN: 1.0000 | Freq: Once | ORAL | 0 refills | Status: AC

## 2022-02-08 NOTE — Telephone Encounter (Signed)
Spoken to patient and colonoscopy have been schedule for 04/19/2022.

## 2022-02-08 NOTE — Telephone Encounter (Signed)
Gastroenterology Pre-Procedure Review  Request Date: 04/19/2022 Requesting Physician: Dr. Allen Norris  PATIENT REVIEW QUESTIONS: The patient responded to the following health history questions as indicated:    1. Are you having any GI issues? no 2. Do you have a personal history of Polyps?  None at last colonoscopy  03/16/2017 3. Do you have a family history of Colon Cancer or Polyps? yes (mom had polyps) 4. Diabetes Mellitus? no 5. Joint replacements in the past 12 months?no 6. Major health problems in the past 3 months?no 7. Any artificial heart valves, MVP, or defibrillator?no    MEDICATIONS & ALLERGIES:    Patient reports the following regarding taking any anticoagulation/antiplatelet therapy:   Plavix, Coumadin, Eliquis, Xarelto, Lovenox, Pradaxa, Brilinta, or Effient? no Aspirin? no  Patient confirms/reports the following medications:  Current Outpatient Medications  Medication Sig Dispense Refill   Na Sulfate-K Sulfate-Mg Sulf 17.5-3.13-1.6 GM/177ML SOLN Take 1 kit by mouth once for 1 dose. 354 mL 0   Cholecalciferol 125 MCG (5000 UT) capsule Take 5,000 Units by mouth once a week.     Chromium 1 MG CAPS Take 1 capsule by mouth daily. 400 mcg     diclofenac Sodium (VOLTAREN) 1 % GEL Apply 1 application. topically daily as needed (pain).     HYDROcodone-acetaminophen (NORCO) 5-325 MG tablet Take 1 tablet by mouth every 4 (four) hours as needed for moderate pain. 5 tablet 0   Omega-3 Fatty Acids (FISH OIL) 1000 MG CAPS Take 2,000 mg by mouth daily.     pyridOXINE (VITAMIN B-6) 100 MG tablet Take 300 mg by mouth daily.     Red Yeast Rice 600 MG CAPS Take 1,200 mg by mouth daily.     vitamin B-12 (CYANOCOBALAMIN) 1000 MCG tablet Take 1,000 mcg by mouth daily.     No current facility-administered medications for this visit.    Patient confirms/reports the following allergies:  No Known Allergies  Orders Placed This Encounter  Procedures   Ambulatory referral to Gastroenterology     Referral Priority:   Routine    Referral Type:   Consultation    Referral Reason:   Specialty Services Required    Referred to Provider:   Lucilla Lame, MD    Number of Visits Requested:   1    AUTHORIZATION INFORMATION Primary Insurance: 1D#: Group #:  Secondary Insurance: 1D#: Group #:  SCHEDULE INFORMATION: Date: 04/19/2022 Time: Location: [facility]

## 2022-02-21 ENCOUNTER — Other Ambulatory Visit: Payer: Self-pay | Admitting: Family Medicine

## 2022-02-21 DIAGNOSIS — Z1231 Encounter for screening mammogram for malignant neoplasm of breast: Secondary | ICD-10-CM

## 2022-03-28 ENCOUNTER — Ambulatory Visit
Admission: RE | Admit: 2022-03-28 | Discharge: 2022-03-28 | Disposition: A | Discharge status: Home / Self Care | Payer: Medicare Other | Source: Ambulatory Visit | Attending: Family Medicine | Admitting: Family Medicine

## 2022-03-28 DIAGNOSIS — Z1231 Encounter for screening mammogram for malignant neoplasm of breast: Secondary | ICD-10-CM | POA: Diagnosis not present

## 2022-04-19 ENCOUNTER — Encounter: Payer: Self-pay | Admitting: Gastroenterology

## 2022-04-19 ENCOUNTER — Ambulatory Visit: Payer: Medicare Other | Admitting: Anesthesiology

## 2022-04-19 ENCOUNTER — Ambulatory Visit
Admission: RE | Admit: 2022-04-19 | Discharge: 2022-04-19 | Disposition: A | Discharge status: Home / Self Care | Payer: Medicare Other | Source: Ambulatory Visit | Attending: Gastroenterology | Admitting: Gastroenterology

## 2022-04-19 ENCOUNTER — Encounter
Admission: RE | Disposition: A | Discharge status: Home / Self Care | Payer: Self-pay | Source: Ambulatory Visit | Attending: Gastroenterology

## 2022-04-19 DIAGNOSIS — Z860100 Personal history of colon polyps, unspecified: Secondary | ICD-10-CM

## 2022-04-19 DIAGNOSIS — K635 Polyp of colon: Secondary | ICD-10-CM

## 2022-04-19 DIAGNOSIS — Z87891 Personal history of nicotine dependence: Secondary | ICD-10-CM | POA: Diagnosis not present

## 2022-04-19 DIAGNOSIS — D123 Benign neoplasm of transverse colon: Secondary | ICD-10-CM | POA: Insufficient documentation

## 2022-04-19 DIAGNOSIS — K640 First degree hemorrhoids: Secondary | ICD-10-CM | POA: Diagnosis not present

## 2022-04-19 DIAGNOSIS — D122 Benign neoplasm of ascending colon: Secondary | ICD-10-CM | POA: Insufficient documentation

## 2022-04-19 DIAGNOSIS — Z1211 Encounter for screening for malignant neoplasm of colon: Secondary | ICD-10-CM | POA: Diagnosis not present

## 2022-04-19 HISTORY — PX: COLONOSCOPY WITH PROPOFOL: SHX5780

## 2022-04-19 SURGERY — COLONOSCOPY WITH PROPOFOL
Anesthesia: General

## 2022-04-19 MED ORDER — PROPOFOL 10 MG/ML IV BOLUS
INTRAVENOUS | Status: DC | PRN
  Administered 2022-04-19: 30 mg via INTRAVENOUS
  Administered 2022-04-19: 50 mg via INTRAVENOUS

## 2022-04-19 MED ORDER — LIDOCAINE HCL (CARDIAC) PF 100 MG/5ML IV SOSY
PREFILLED_SYRINGE | INTRAVENOUS | Status: DC | PRN
Start: 2022-04-19 — End: 2022-04-19
  Administered 2022-04-19: 60 mg via INTRAVENOUS

## 2022-04-19 MED ORDER — LIDOCAINE HCL (PF) 2 % IJ SOLN
INTRAMUSCULAR | Status: AC
  Filled 2022-04-19: qty 5

## 2022-04-19 MED ORDER — SODIUM CHLORIDE 0.9 % IV SOLN
INTRAVENOUS | Status: DC
  Administered 2022-04-19: 1000 mL via INTRAVENOUS

## 2022-04-19 MED ORDER — PROPOFOL 500 MG/50ML IV EMUL
INTRAVENOUS | Status: DC | PRN
  Administered 2022-04-19: 75 ug/kg/min via INTRAVENOUS

## 2022-04-19 NOTE — Anesthesia Preprocedure Evaluation (Signed)
Anesthesia Evaluation  Patient identified by MRN, date of birth, ID band Patient awake    Reviewed: Allergy & Precautions, NPO status , Patient's Chart, lab work & pertinent test results  History of Anesthesia Complications Negative for: history of anesthetic complications  Airway Mallampati: III  TM Distance: >3 FB Neck ROM: full    Dental no notable dental hx.    Pulmonary neg pulmonary ROS, former smoker   Pulmonary exam normal        Cardiovascular negative cardio ROS Normal cardiovascular exam     Neuro/Psych  Headaches  negative psych ROS   GI/Hepatic negative GI ROS, Neg liver ROS,,,  Endo/Other  negative endocrine ROS    Renal/GU negative Renal ROS  negative genitourinary   Musculoskeletal   Abdominal   Peds  Hematology negative hematology ROS (+)   Anesthesia Other Findings Past Medical History: No date: Colon polyp No date: Headache     Comment:  pre menopausal No date: Thyroid nodule No date: Urinary incontinence  Past Surgical History: No date: COLONOSCOPY 03/16/2017: COLONOSCOPY WITH PROPOFOL; N/A     Comment:  Procedure: COLONOSCOPY WITH PROPOFOL;  Surgeon: Lucilla Lame, MD;  Location: [facility];  Service:               Endoscopy;  Laterality: N/A; 05/24/2021: DORSAL COMPARTMENT RELEASE; Left     Comment:  Procedure: RELEASE DORSAL COMPARTMENT (DEQUERVAIN);                Surgeon: Dereck Leep, MD;  Location: [facility];                Service: Orthopedics;  Laterality: Left; No date: TONSILLECTOMY No date: TUBAL LIGATION  BMI    Body Mass Index: 27.81 kg/m      Reproductive/Obstetrics negative OB ROS                             Anesthesia Physical Anesthesia Plan  ASA: 2  Anesthesia Plan: General   Post-op Pain Management: Minimal or no pain anticipated   Induction: Intravenous  PONV Risk Score and Plan: Propofol infusion and  TIVA  Airway Management Planned: Natural Airway and Nasal Cannula  Additional Equipment:   Intra-op Plan:   Post-operative Plan:   Informed Consent: I have reviewed the patients History and Physical, chart, labs and discussed the procedure including the risks, benefits and alternatives for the proposed anesthesia with the patient or authorized representative who has indicated his/her understanding and acceptance.     Dental Advisory Given  Plan Discussed with: Anesthesiologist, CRNA and Surgeon  Anesthesia Plan Comments: (Patient consented for risks of anesthesia including but not limited to:  - adverse reactions to medications - risk of airway placement if required - damage to eyes, teeth, lips or other oral mucosa - nerve damage due to positioning  - sore throat or hoarseness - Damage to heart, brain, nerves, lungs, other parts of body or loss of life  Patient voiced understanding.)       Anesthesia Quick Evaluation

## 2022-04-19 NOTE — Anesthesia Postprocedure Evaluation (Signed)
Anesthesia Post Note  Patient: Nancy Russo  Procedure(s) Performed: COLONOSCOPY WITH PROPOFOL  Patient location during evaluation: Endoscopy Anesthesia Type: General Level of consciousness: awake and alert Pain management: pain level controlled Vital Signs Assessment: post-procedure vital signs reviewed and stable Respiratory status: spontaneous breathing, nonlabored ventilation, respiratory function stable and patient connected to nasal cannula oxygen Cardiovascular status: blood pressure returned to baseline and stable Postop Assessment: no apparent nausea or vomiting Anesthetic complications: no   No notable events documented.   Last Vitals:  Vitals:   04/19/22 1005 04/19/22 1021  BP: 121/62 (!) 151/79  Pulse: 68 65  Resp: 17 13  Temp: (!) 35.9 C   SpO2: 100% 100%    Last Pain:  Vitals:   04/19/22 1021  TempSrc:   PainSc: 0-No pain                 Ilene Qua

## 2022-04-19 NOTE — H&P (Signed)
Lucilla Lame, MD [address] [place] [place], [place] [address] Phone:[phone] Fax : [phone]  Primary Care Physician:  Dion Body, MD Primary Gastroenterologist:  Dr. Allen Norris  Pre-Procedure History & Physical: HPI:  Nancy Russo is a 72 y.o. female is here for an colonoscopy.   Past Medical History:  Diagnosis Date   Colon polyp    Headache    pre menopausal   Thyroid nodule    Urinary incontinence     Past Surgical History:  Procedure Laterality Date   COLONOSCOPY     COLONOSCOPY WITH PROPOFOL N/A 03/16/2017   Procedure: COLONOSCOPY WITH PROPOFOL;  Surgeon: Lucilla Lame, MD;  Location: [facility];  Service: Endoscopy;  Laterality: N/A;   DORSAL COMPARTMENT RELEASE Left 05/24/2021   Procedure: RELEASE DORSAL COMPARTMENT (DEQUERVAIN);  Surgeon: Dereck Leep, MD;  Location: [facility];  Service: Orthopedics;  Laterality: Left;   TONSILLECTOMY     TUBAL LIGATION      Prior to Admission medications   Medication Sig Start Date End Date Taking? Authorizing Provider  Cholecalciferol 125 MCG (5000 UT) capsule Take 5,000 Units by mouth once a week.   Yes [provider]  Chromium 1 MG CAPS Take 1 capsule by mouth daily. 400 mcg   Yes [provider]  Omega-3 Fatty Acids (FISH OIL) 1000 MG CAPS Take 2,000 mg by mouth daily.   Yes [provider]  pyridOXINE (VITAMIN B-6) 100 MG tablet Take 300 mg by mouth daily.   Yes [provider]  Red Yeast Rice 600 MG CAPS Take 1,200 mg by mouth daily.   Yes [provider]  vitamin B-12 (CYANOCOBALAMIN) 1000 MCG tablet Take 1,000 mcg by mouth daily.   Yes [provider]  diclofenac Sodium (VOLTAREN) 1 % GEL Apply 1 application. topically daily as needed (pain). Patient not taking: Reported on 04/19/2022    [provider]  HYDROcodone-acetaminophen (NORCO) 5-325 MG tablet Take 1 tablet by mouth every 4 (four) hours as needed for moderate  pain. Patient not taking: Reported on 04/19/2022 05/24/21   Dereck Leep, MD    Allergies as of 02/08/2022   (No Known Allergies)    Family History  Problem Relation Age of Onset   Diabetes Mellitus II Mother    Heart attack Father    Stroke Father    Diabetes Mellitus II Sister    Breast cancer Granddaughter 33    Social History   Socioeconomic History   Marital status: Married    Spouse name: Lanae Boast   Number of children: Not on file   Years of education: Not on file   Highest education level: Not on file  Occupational History   Not on file  Tobacco Use   Smoking status: Former   Smokeless tobacco: Never  Vaping Use   Vaping Use: Never used  Substance and Sexual Activity   Alcohol use: No   Drug use: No   Sexual activity: Not Currently    Birth control/protection: Post-menopausal  Other Topics Concern   Not on file  Social History Narrative   Not on file   Social Determinants of Health   Financial Resource Strain: Not on file  Food Insecurity: Not on file  Transportation Needs: Not on file  Physical Activity: Not on file  Stress: Not on file  Social Connections: Not on file  Intimate Partner Violence: Not on file    Review of Systems: See HPI, otherwise negative ROS  Physical Exam: BP Marland Kitchen)  140/76   Pulse 84   Temp (!) 96.8 F (36 C) (Temporal)   Resp 17   Ht 5\' 3"  (1.6 m)   Wt 71.2 kg   SpO2 98%   BMI 27.81 kg/m  General:   Alert,  pleasant and cooperative in NAD Head:  Normocephalic and atraumatic. Neck:  Supple; no masses or thyromegaly. Lungs:  Clear throughout to auscultation.    Heart:  Regular rate and rhythm. Abdomen:  Soft, nontender and nondistended. Normal bowel sounds, without guarding, and without rebound.   Neurologic:  Alert and  oriented x4;  grossly normal neurologically.  Impression/Plan: Nancy Russo is here for an colonoscopy to be performed for a history of adenomatous polyps on 2019   Risks, benefits, limitations,  and alternatives regarding  colonoscopy have been reviewed with the patient.  Questions have been answered.  All parties agreeable.   Lucilla Lame, MD  04/19/2022, 9:27 AM

## 2022-04-19 NOTE — Op Note (Signed)
[facility] Gastroenterology Patient Name: Nancy Russo Procedure Date: 04/19/2022 9:28 AM MRN: [id number] Account #: [id number] Date of Birth: 13-Sep-1950 Admit Type: Outpatient Age: 72 Room: [facility] ENDO ROOM 4 Gender: Female Note Status: Finalized Instrument Name: Jasper Riling [id number] Procedure:             Colonoscopy Indications:           High risk colon cancer surveillance: Personal history                         of colonic polyps Providers:             Lucilla Lame MD, MD Medicines:             Propofol per Anesthesia Complications:         No immediate complications. Procedure:             Pre-Anesthesia Assessment:                        - Prior to the procedure, a History and Physical was                         performed, and patient medications and allergies were                         reviewed. The patient's tolerance of previous                         anesthesia was also reviewed. The risks and benefits                         of the procedure and the sedation options and risks                         were discussed with the patient. All questions were                         answered, and informed consent was obtained. Prior                         Anticoagulants: The patient has taken no anticoagulant                         or antiplatelet agents. ASA Grade Assessment: II - A                         patient with mild systemic disease. After reviewing                         the risks and benefits, the patient was deemed in                         satisfactory condition to undergo the procedure.                        After obtaining informed consent, the colonoscope was                         passed under direct vision. Throughout  the procedure,                         the patient's blood pressure, pulse, and oxygen                         saturations were monitored continuously. The                         Colonoscope was introduced through the  anus and                         advanced to the the cecum, identified by appendiceal                         orifice and ileocecal valve. The colonoscopy was                         performed without difficulty. The patient tolerated                         the procedure well. The quality of the bowel                         preparation was excellent. Findings:      The perianal and digital rectal examinations were normal.      A 4 mm polyp was found in the transverse colon. The polyp was sessile.       The polyp was removed with a cold biopsy forceps. Resection and       retrieval were complete.      A 3 mm polyp was found in the ascending colon. The polyp was sessile.       The polyp was removed with a cold biopsy forceps. Resection and       retrieval were complete.      Non-bleeding internal hemorrhoids were found during retroflexion. The       hemorrhoids were Grade I (internal hemorrhoids that do not prolapse). Impression:            - One 4 mm polyp in the transverse colon, removed with                         a cold biopsy forceps. Resected and retrieved.                        - One 3 mm polyp in the ascending colon, removed with                         a cold biopsy forceps. Resected and retrieved.                        - Non-bleeding internal hemorrhoids. Recommendation:        - Discharge patient to home.                        - Resume previous diet.                        - Continue present medications.                        -  Await pathology results.                        - Repeat colonoscopy in 5 years for surveillance. Procedure Code(s):     --- Professional ---                        [phone], Colonoscopy, flexible; with biopsy, single or                         multiple Diagnosis Code(s):     --- Professional ---                        Z86.010, Personal history of colonic polyps                        D12.3, Benign neoplasm of transverse colon (hepatic                          flexure or splenic flexure) CPT copyright 2022 [employer]. All rights reserved. The codes documented in this report are preliminary and upon coder review may  be revised to meet current compliance requirements. Lucilla Lame MD, MD 04/19/2022 10:03:17 AM This report has been signed electronically. Number of Addenda: 0 Note Initiated On: 04/19/2022 9:28 AM Scope Withdrawal Time: 0 hours 9 minutes 56 seconds  Total Procedure Duration: 0 hours 19 minutes 47 seconds  Estimated Blood Loss:  Estimated blood loss: none.      [facility]

## 2022-04-19 NOTE — Transfer of Care (Signed)
Immediate Anesthesia Transfer of Care Note  Patient: Nancy Russo  Procedure(s) Performed: COLONOSCOPY WITH PROPOFOL  Patient Location: PACU  Anesthesia Type:General  Level of Consciousness: sedated  Airway & Oxygen Therapy: Patient Spontanous Breathing  Post-op Assessment: Report given to RN and Post -op Vital signs reviewed and stable  Post vital signs: Reviewed and stable  Last Vitals:  Vitals Value Taken Time  BP    Temp    Pulse    Resp    SpO2      Last Pain:  Vitals:   04/19/22 0917  TempSrc: Temporal  PainSc: 0-No pain         Complications: No notable events documented.

## 2022-04-20 ENCOUNTER — Encounter: Payer: Self-pay | Admitting: Gastroenterology

## 2022-04-20 LAB — SURGICAL PATHOLOGY

## 2022-04-21 ENCOUNTER — Encounter: Payer: Self-pay | Admitting: Gastroenterology

## 2022-05-05 ENCOUNTER — Other Ambulatory Visit: Payer: Self-pay | Admitting: Family Medicine

## 2022-05-05 DIAGNOSIS — Z8249 Family history of ischemic heart disease and other diseases of the circulatory system: Secondary | ICD-10-CM

## 2022-05-05 DIAGNOSIS — Z9189 Other specified personal risk factors, not elsewhere classified: Secondary | ICD-10-CM

## 2022-05-05 DIAGNOSIS — E7800 Pure hypercholesterolemia, unspecified: Secondary | ICD-10-CM

## 2022-05-18 ENCOUNTER — Inpatient Hospital Stay: Admission: RE | Admit: 2022-05-18 | Payer: Medicare Other | Source: Ambulatory Visit

## 2022-07-18 ENCOUNTER — Ambulatory Visit
Admission: RE | Admit: 2022-07-18 | Discharge: 2022-07-18 | Disposition: A | Discharge status: Home / Self Care | Payer: Medicare Other | Source: Ambulatory Visit | Attending: Family Medicine | Admitting: Family Medicine

## 2022-07-18 DIAGNOSIS — E7800 Pure hypercholesterolemia, unspecified: Secondary | ICD-10-CM | POA: Insufficient documentation

## 2022-07-18 DIAGNOSIS — Z9189 Other specified personal risk factors, not elsewhere classified: Secondary | ICD-10-CM | POA: Insufficient documentation

## 2022-07-18 DIAGNOSIS — Z8249 Family history of ischemic heart disease and other diseases of the circulatory system: Secondary | ICD-10-CM | POA: Insufficient documentation

## 2023-02-27 ENCOUNTER — Other Ambulatory Visit: Payer: Self-pay | Admitting: Family Medicine

## 2023-02-27 DIAGNOSIS — Z1231 Encounter for screening mammogram for malignant neoplasm of breast: Secondary | ICD-10-CM

## 2023-03-29 ENCOUNTER — Ambulatory Visit
Admission: RE | Admit: 2023-03-29 | Discharge: 2023-03-29 | Disposition: A | Discharge status: Home / Self Care | Payer: Medicare Other | Source: Ambulatory Visit | Attending: Family Medicine | Admitting: Family Medicine

## 2023-03-29 DIAGNOSIS — Z1231 Encounter for screening mammogram for malignant neoplasm of breast: Secondary | ICD-10-CM | POA: Insufficient documentation

## 2023-12-07 IMAGING — MG MM DIGITAL SCREENING BILAT W/ TOMO AND CAD
6 of 10 series · 6 of 30 positions shown · non-contrast
Comparison: Previous exam(s).

CLINICAL DATA: Screening.

EXAM:
DIGITAL SCREENING BILATERAL MAMMOGRAM WITH TOMOSYNTHESIS AND CAD
TECHNIQUE: Bilateral screening digital craniocaudal and mediolateral oblique
mammograms were obtained. Bilateral screening digital breast
tomosynthesis was performed. The images were evaluated with
computer-aided detection.

[R MLO synth-2D (1 of 2)]
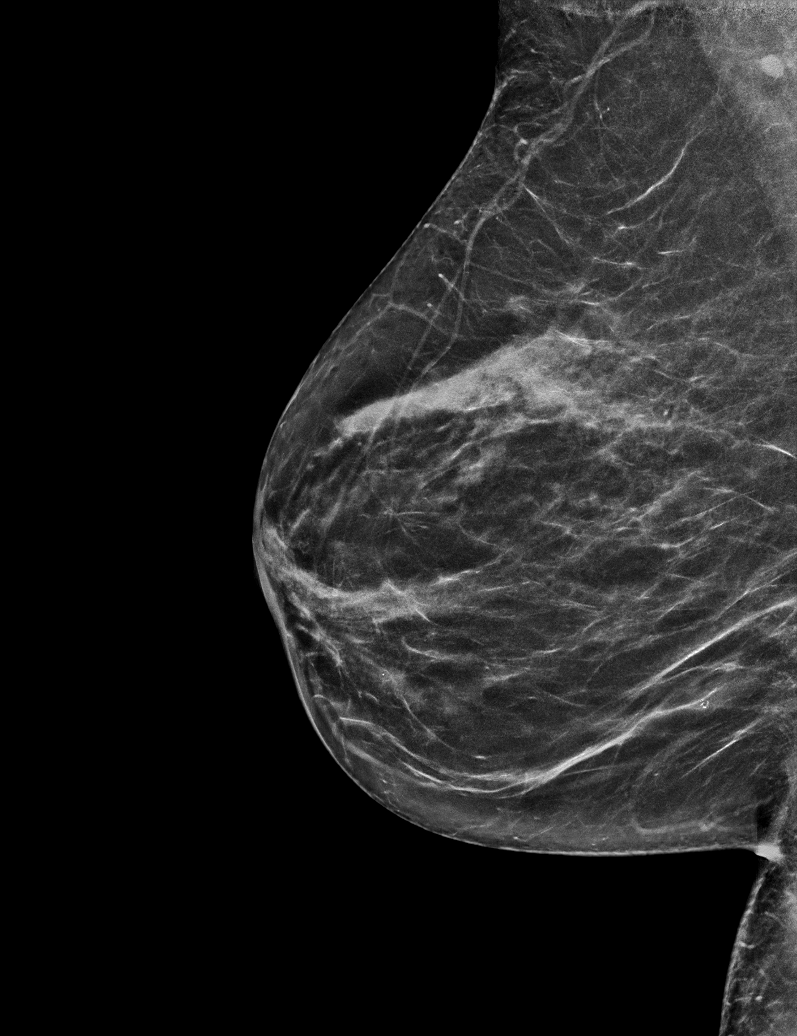

[L CC synth-2D]
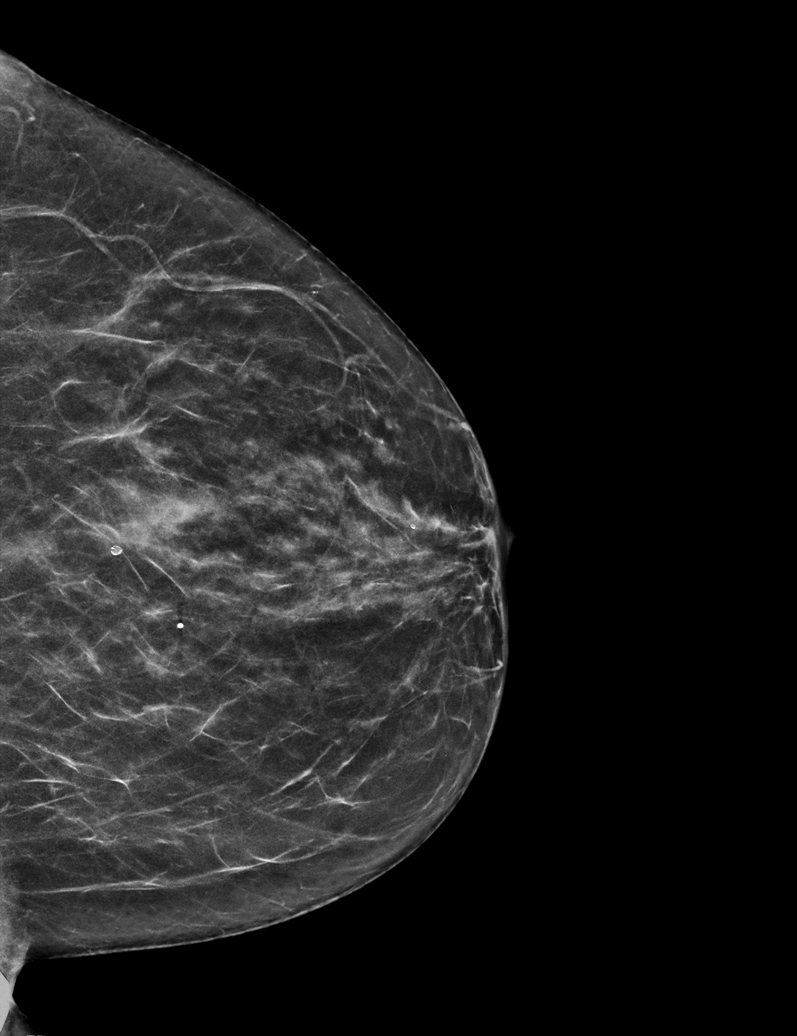

[R MLO synth-2D (2 of 2)]
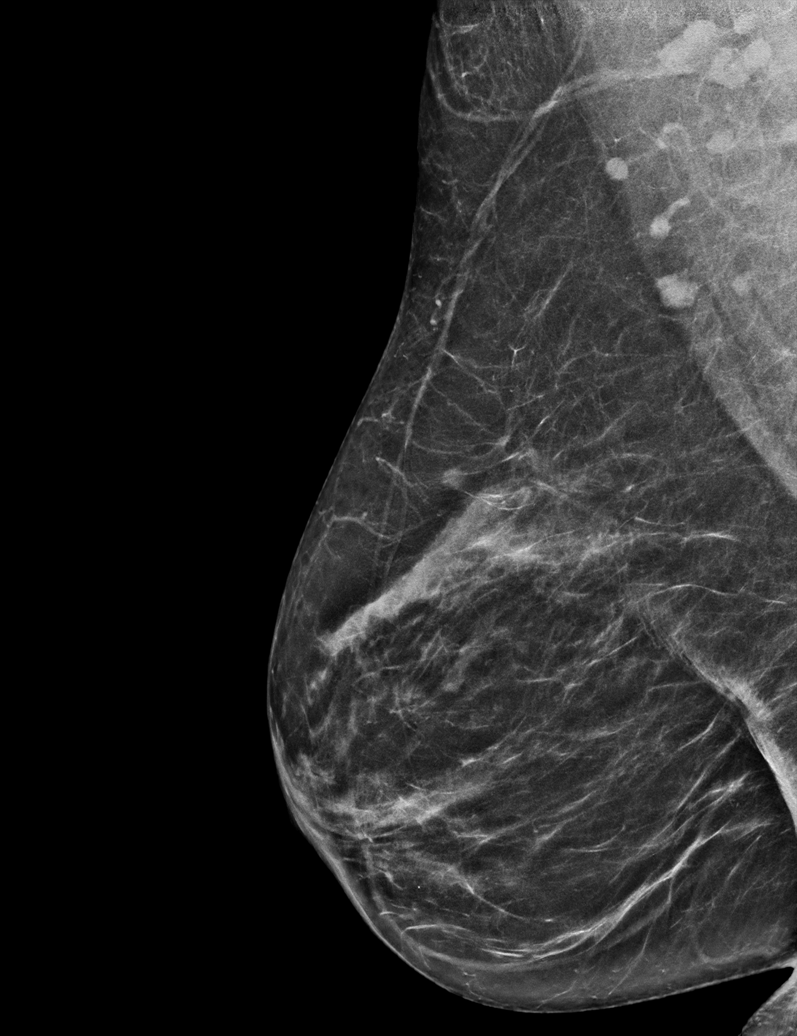

[L MLO synth-2D]
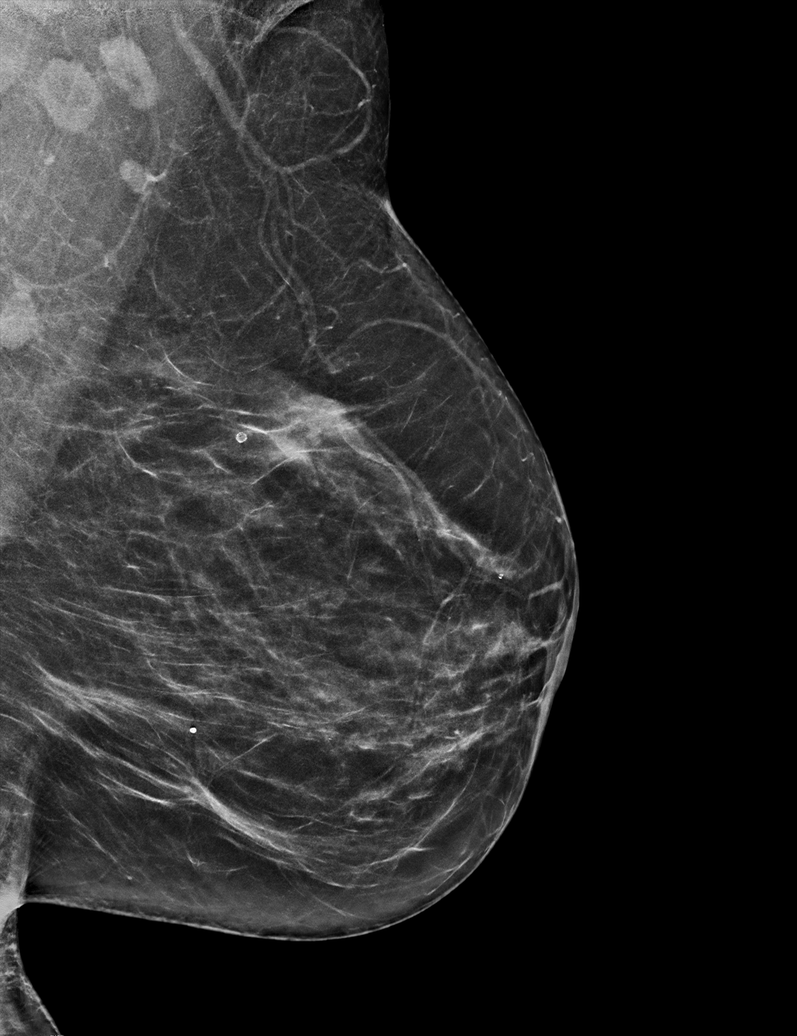

[R CC synth-2D]
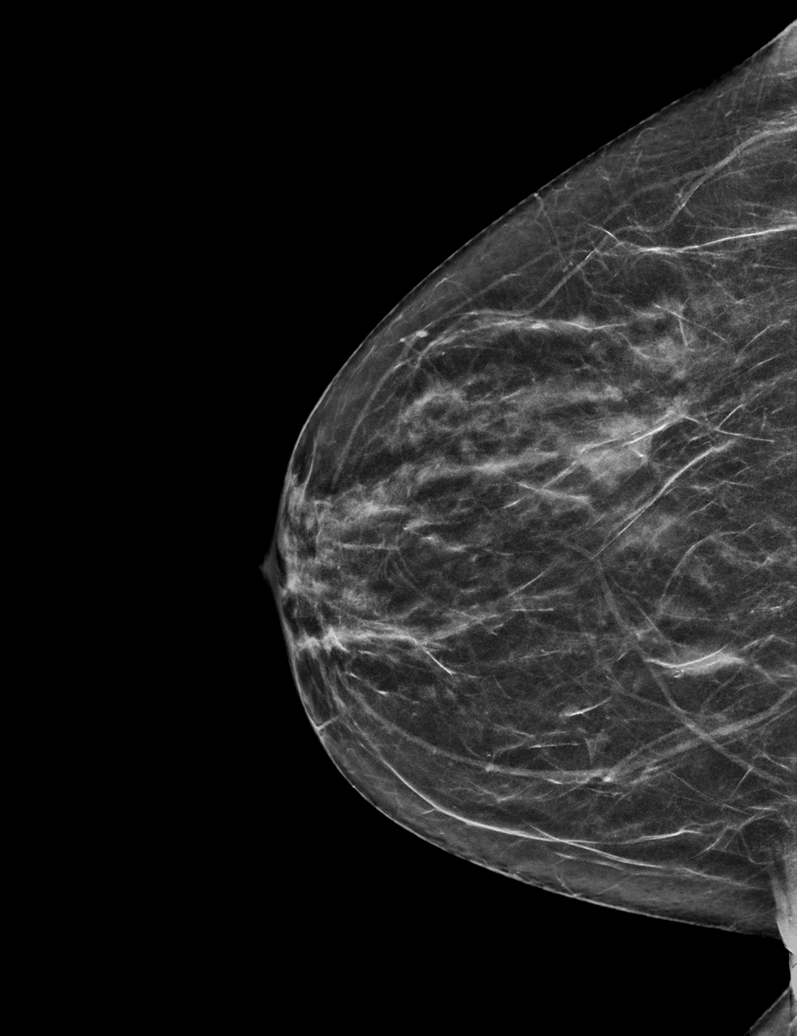

[R CC tomo · tomo slice 25/49.0]
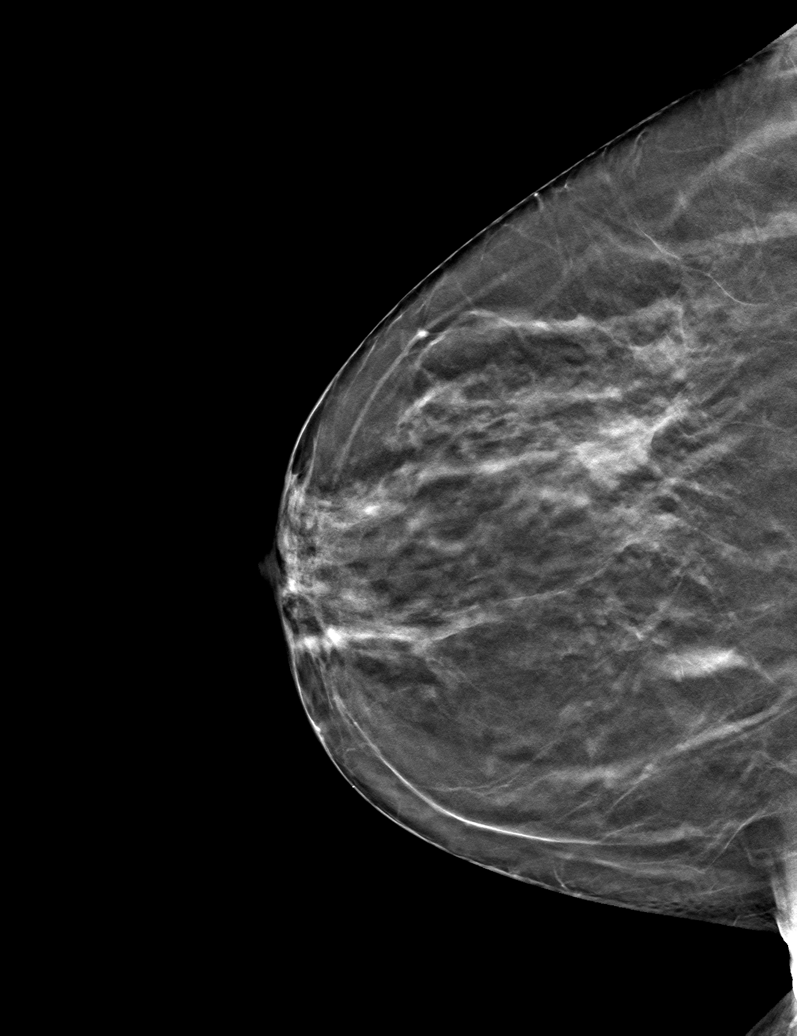

[6 of 30 positions shown; findings below may reference images not displayed]

ACR Breast Density Category b: There are scattered areas of
fibroglandular density.
FINDINGS: There are no findings suspicious for malignancy.
IMPRESSION: No mammographic evidence of malignancy. A result letter of this
screening mammogram will be mailed directly to the patient.

RECOMMENDATION:
Screening mammogram in one year. (Code:51-O-LD2)

BI-RADS CATEGORY  1: Negative.
